# Patient Record
Sex: Male | Born: 1965 | Race: White | Hispanic: No | Marital: Single | Smoking: Never smoker
Health system: Southern US, Community
[De-identification: ages and names within clinical notes are randomized; demographics above are authoritative.]

## PROBLEM LIST (undated history)

## (undated) DIAGNOSIS — I10 Essential (primary) hypertension: Secondary | ICD-10-CM

## (undated) DIAGNOSIS — F840 Autistic disorder: Secondary | ICD-10-CM

## (undated) DIAGNOSIS — F909 Attention-deficit hyperactivity disorder, unspecified type: Secondary | ICD-10-CM

## (undated) DIAGNOSIS — N3941 Urge incontinence: Secondary | ICD-10-CM

## (undated) DIAGNOSIS — M47816 Spondylosis without myelopathy or radiculopathy, lumbar region: Secondary | ICD-10-CM

## (undated) DIAGNOSIS — M8580 Other specified disorders of bone density and structure, unspecified site: Secondary | ICD-10-CM

## (undated) DIAGNOSIS — F419 Anxiety disorder, unspecified: Secondary | ICD-10-CM

## (undated) DIAGNOSIS — E559 Vitamin D deficiency, unspecified: Secondary | ICD-10-CM

## (undated) DIAGNOSIS — Q992 Fragile X chromosome: Secondary | ICD-10-CM

## (undated) DIAGNOSIS — E781 Pure hyperglyceridemia: Secondary | ICD-10-CM

## (undated) DIAGNOSIS — E039 Hypothyroidism, unspecified: Secondary | ICD-10-CM

## (undated) DIAGNOSIS — D696 Thrombocytopenia, unspecified: Secondary | ICD-10-CM

## (undated) HISTORY — PX: ELBOW SURGERY: SHX618

## (undated) HISTORY — PX: DENTAL SURGERY: SHX609

---

## 2009-07-17 ENCOUNTER — Emergency Department (HOSPITAL_BASED_OUTPATIENT_CLINIC_OR_DEPARTMENT_OTHER): Admission: EM | Admit: 2009-07-17 | Discharge: 2009-07-17 | Payer: Self-pay | Admitting: Emergency Medicine

## 2009-07-17 ENCOUNTER — Ambulatory Visit: Payer: Self-pay | Admitting: Radiology

## 2009-07-23 ENCOUNTER — Ambulatory Visit (HOSPITAL_COMMUNITY): Admission: RE | Admit: 2009-07-23 | Discharge: 2009-07-25 | Payer: Self-pay | Admitting: Orthopedic Surgery

## 2010-12-22 LAB — CBC
MCHC: 35.7 g/dL (ref 30.0–36.0)
MCV: 92.9 fL (ref 78.0–100.0)
RDW: 12.6 % (ref 11.5–15.5)

## 2010-12-22 LAB — BASIC METABOLIC PANEL
CO2: 26 mEq/L (ref 19–32)
Creatinine, Ser: 0.75 mg/dL (ref 0.4–1.5)
Glucose, Bld: 129 mg/dL — ABNORMAL HIGH (ref 70–99)
Potassium: 4.3 mEq/L (ref 3.5–5.1)
Sodium: 135 mEq/L (ref 135–145)

## 2011-05-24 IMAGING — CR DG CHEST 2V
2 series · 2 of 2 positions shown · non-contrast
Comparison: None

CLINICAL DATA: Preop left elbow surgery.

CHEST - 2 VIEW

[view not recorded (1 of 2)]
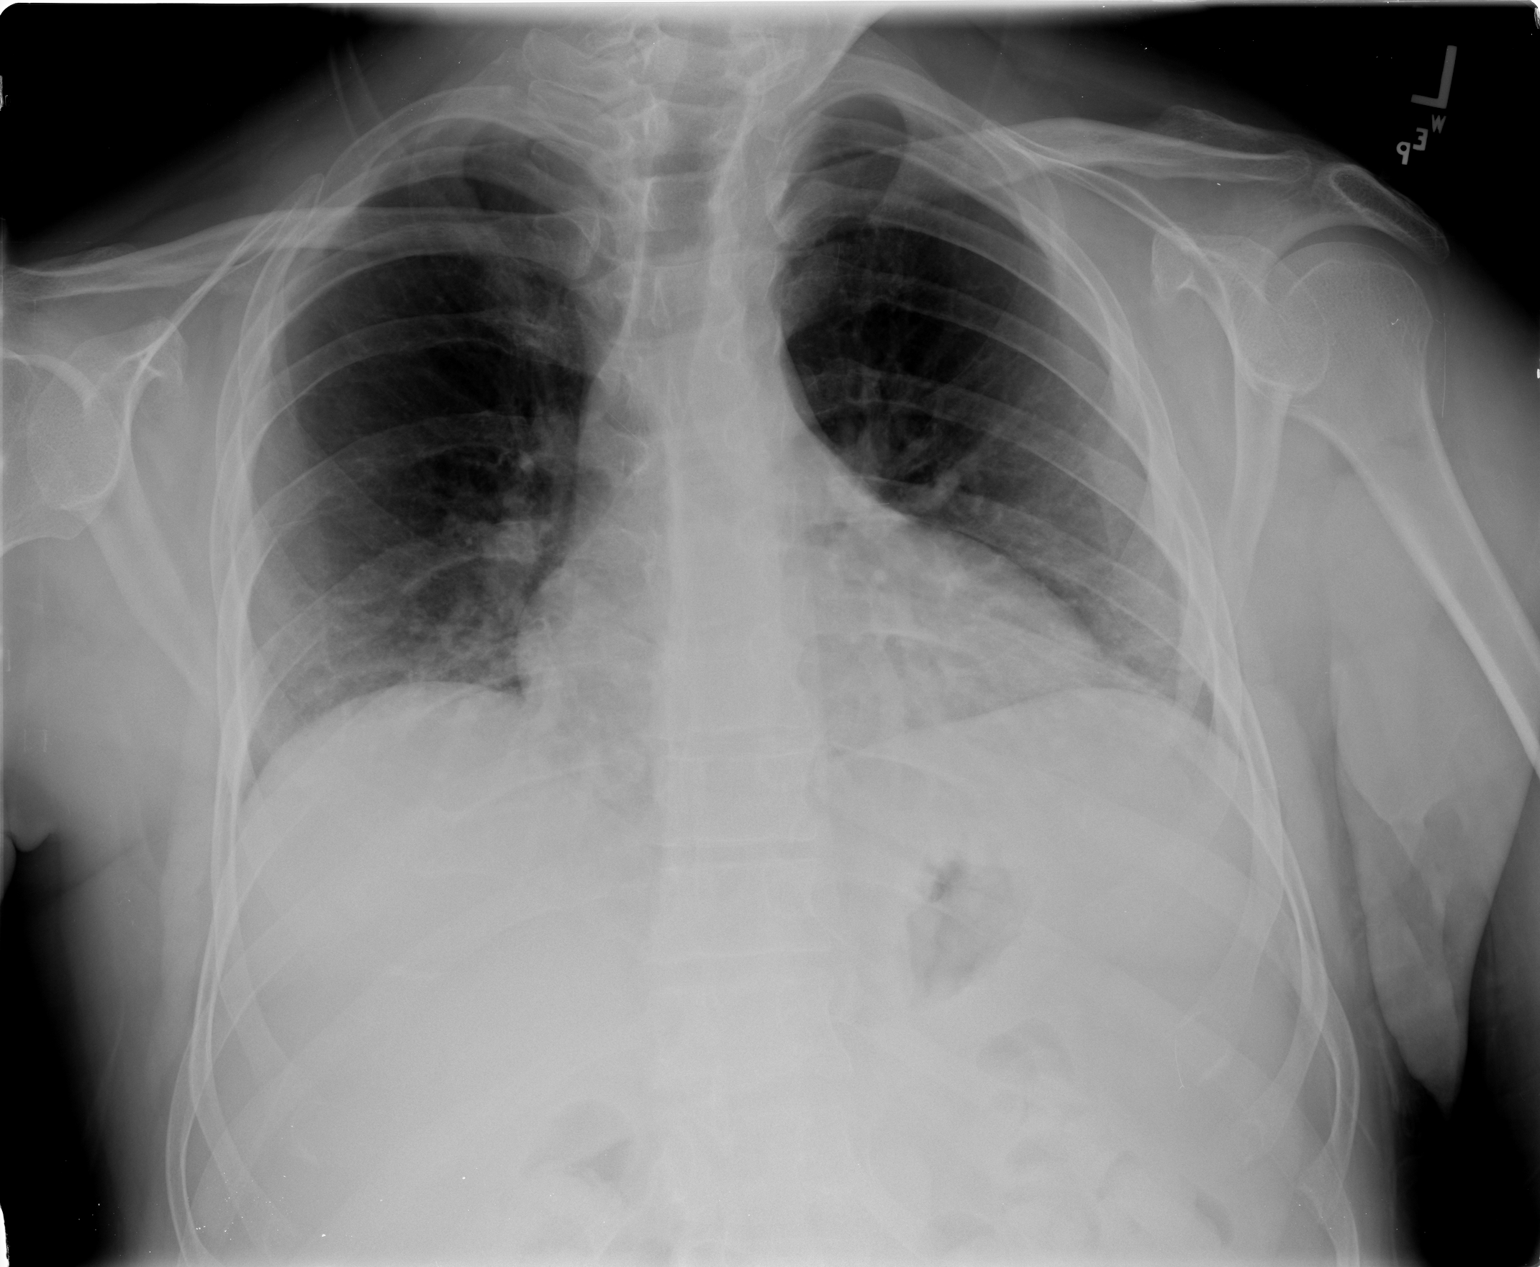

[view not recorded (2 of 2)]
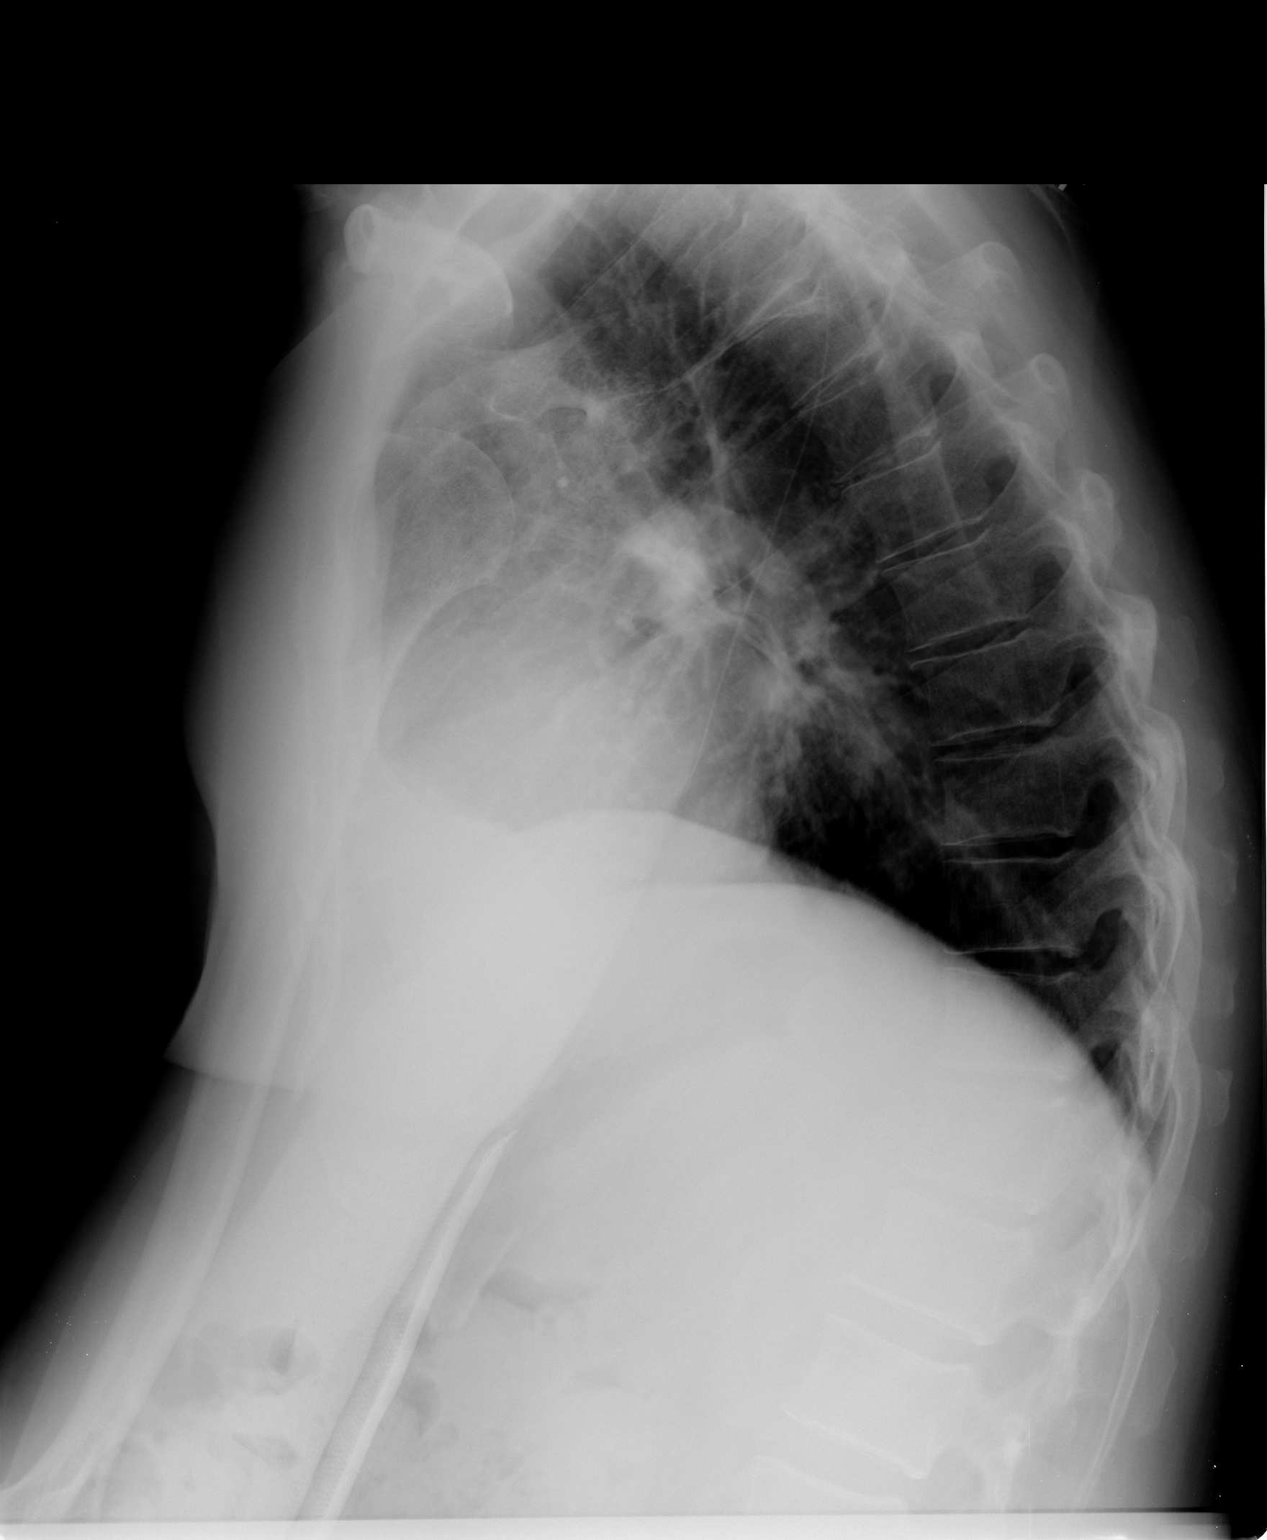

[2 of 2 positions shown; findings below may reference images not displayed]

FINDINGS: The heart size is mildly enlarged.

There is no pleural effusion or interstitial edema.

The lung volumes are low.

There is bibasilar atelectasis.

No airspace consolidation.
IMPRESSION: 1.  Low lung volumes and bibasilar atelectasis.

## 2011-05-25 IMAGING — CR DG ELBOW 2V*L*
2 series · 2 of 2 positions shown · non-contrast
Comparison: Preoperative films of 07/17/2009

CLINICAL DATA: Fixation of left distal humeral fracture

LEFT ELBOW - 2 VIEW

[view not recorded (1 of 2)]
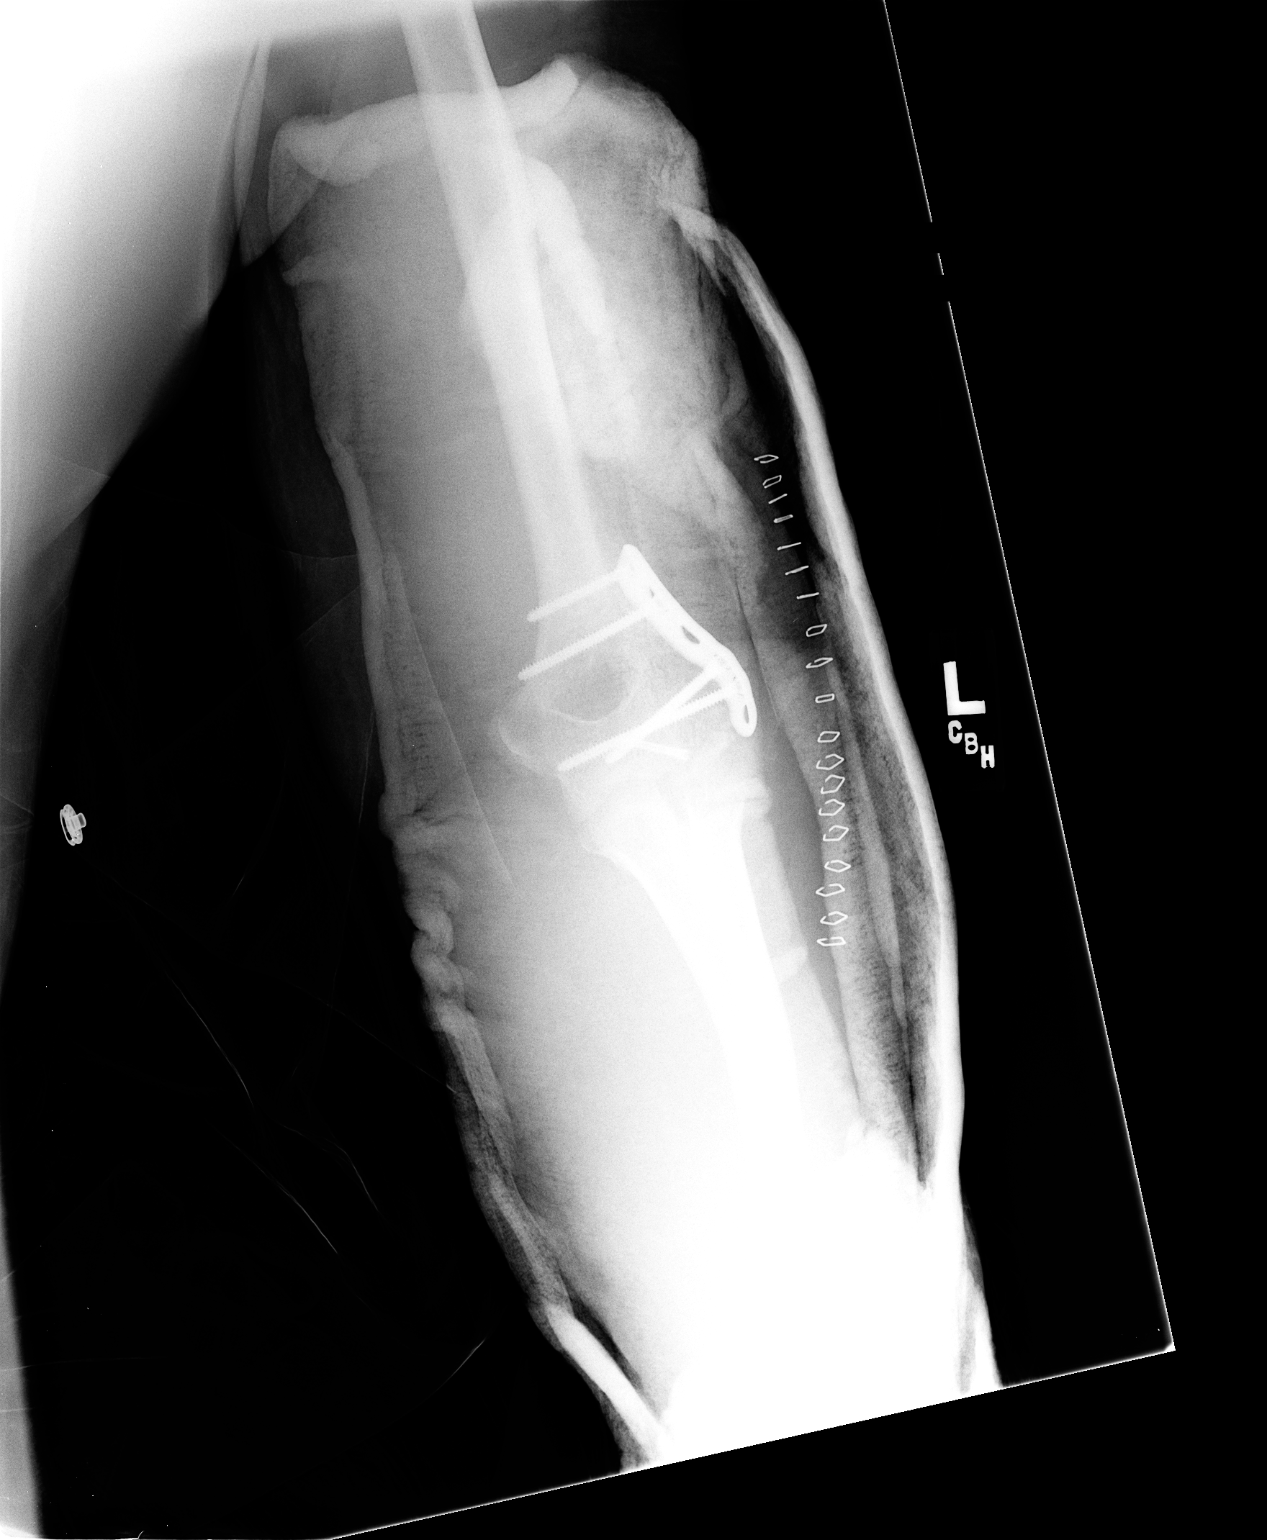

[view not recorded (2 of 2)]
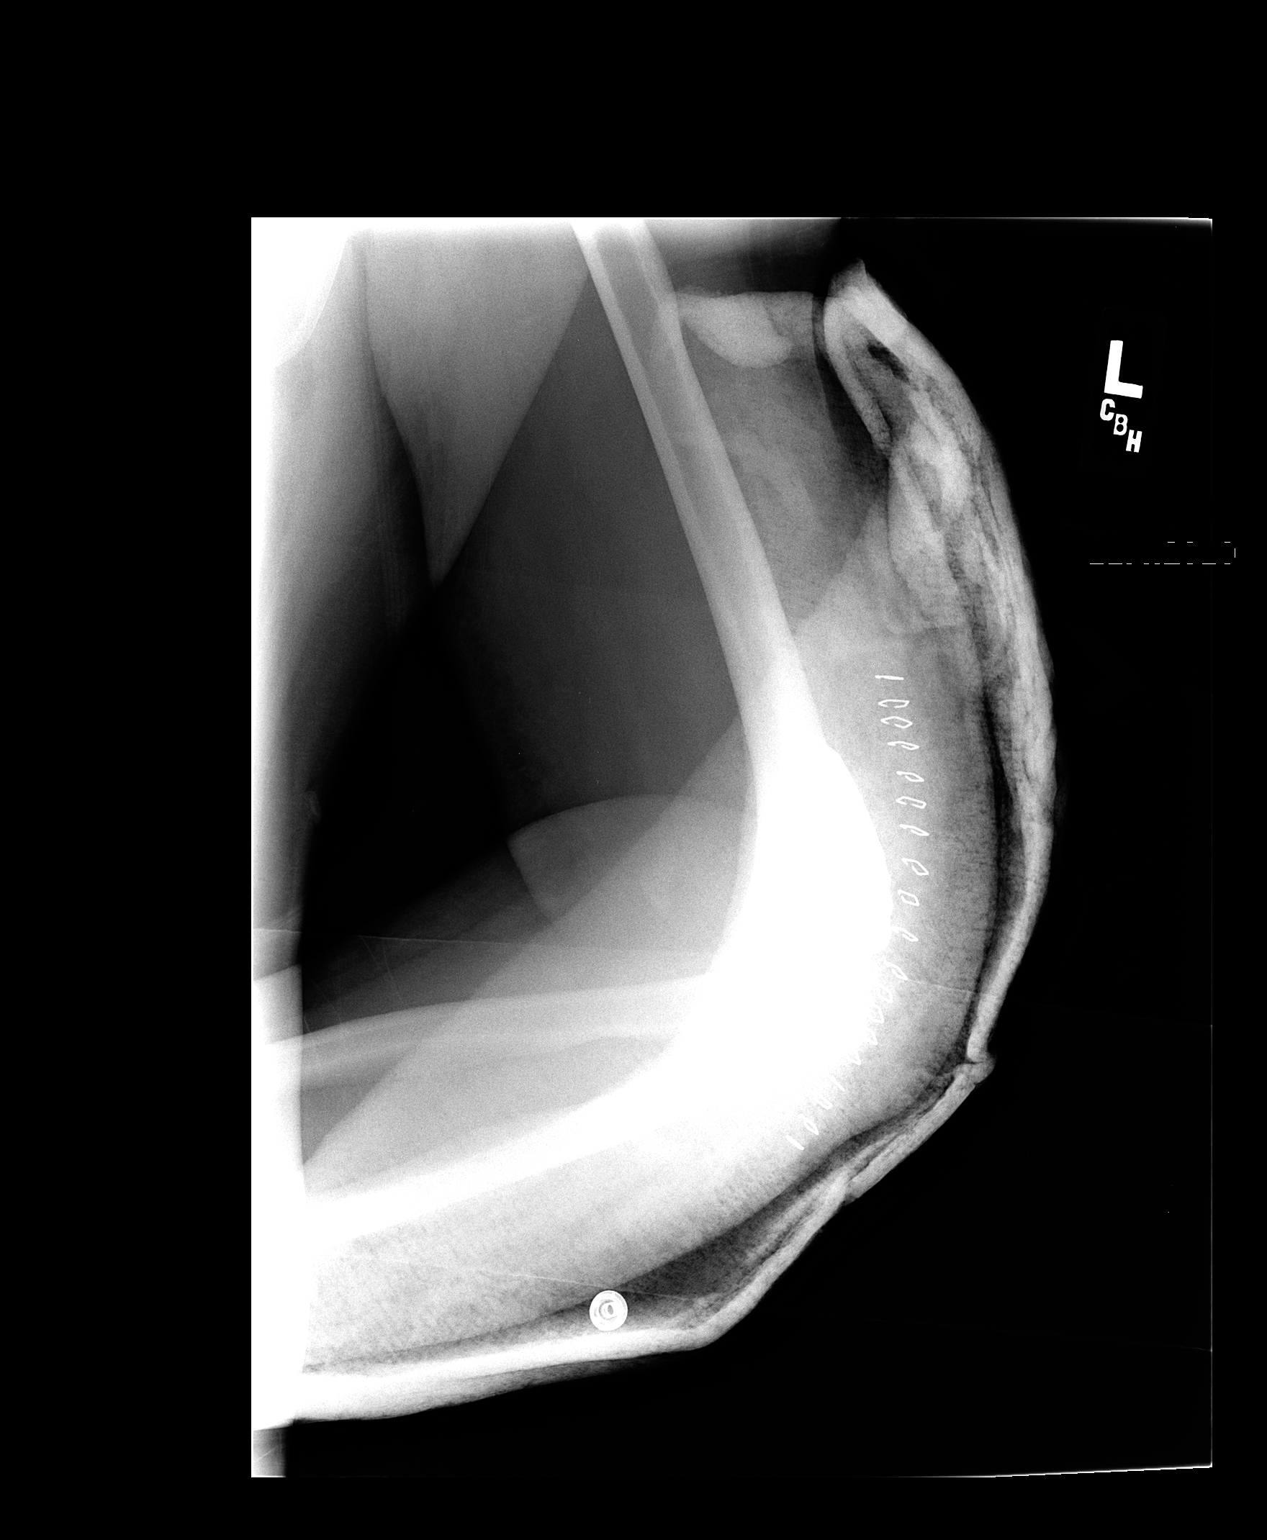

[2 of 2 positions shown; findings below may reference images not displayed]

FINDINGS: Plate and screw fixation of the distal left lateral
humeral epicondylar fracture is noted in good position, with the
left arm in cast.
IMPRESSION: Fixation of distal left lateral humeral epicondylar fracture.

## 2011-05-26 IMAGING — CR DG ELBOW 2V*L*
1 series · 1 of 1 positions shown · non-contrast
Comparison: Left elbow films of 07/24/1999

CLINICAL DATA: Postop fixation of distal left humeral fracture

LEFT ELBOW - 2 VIEW

[x elbow joint lat left *]
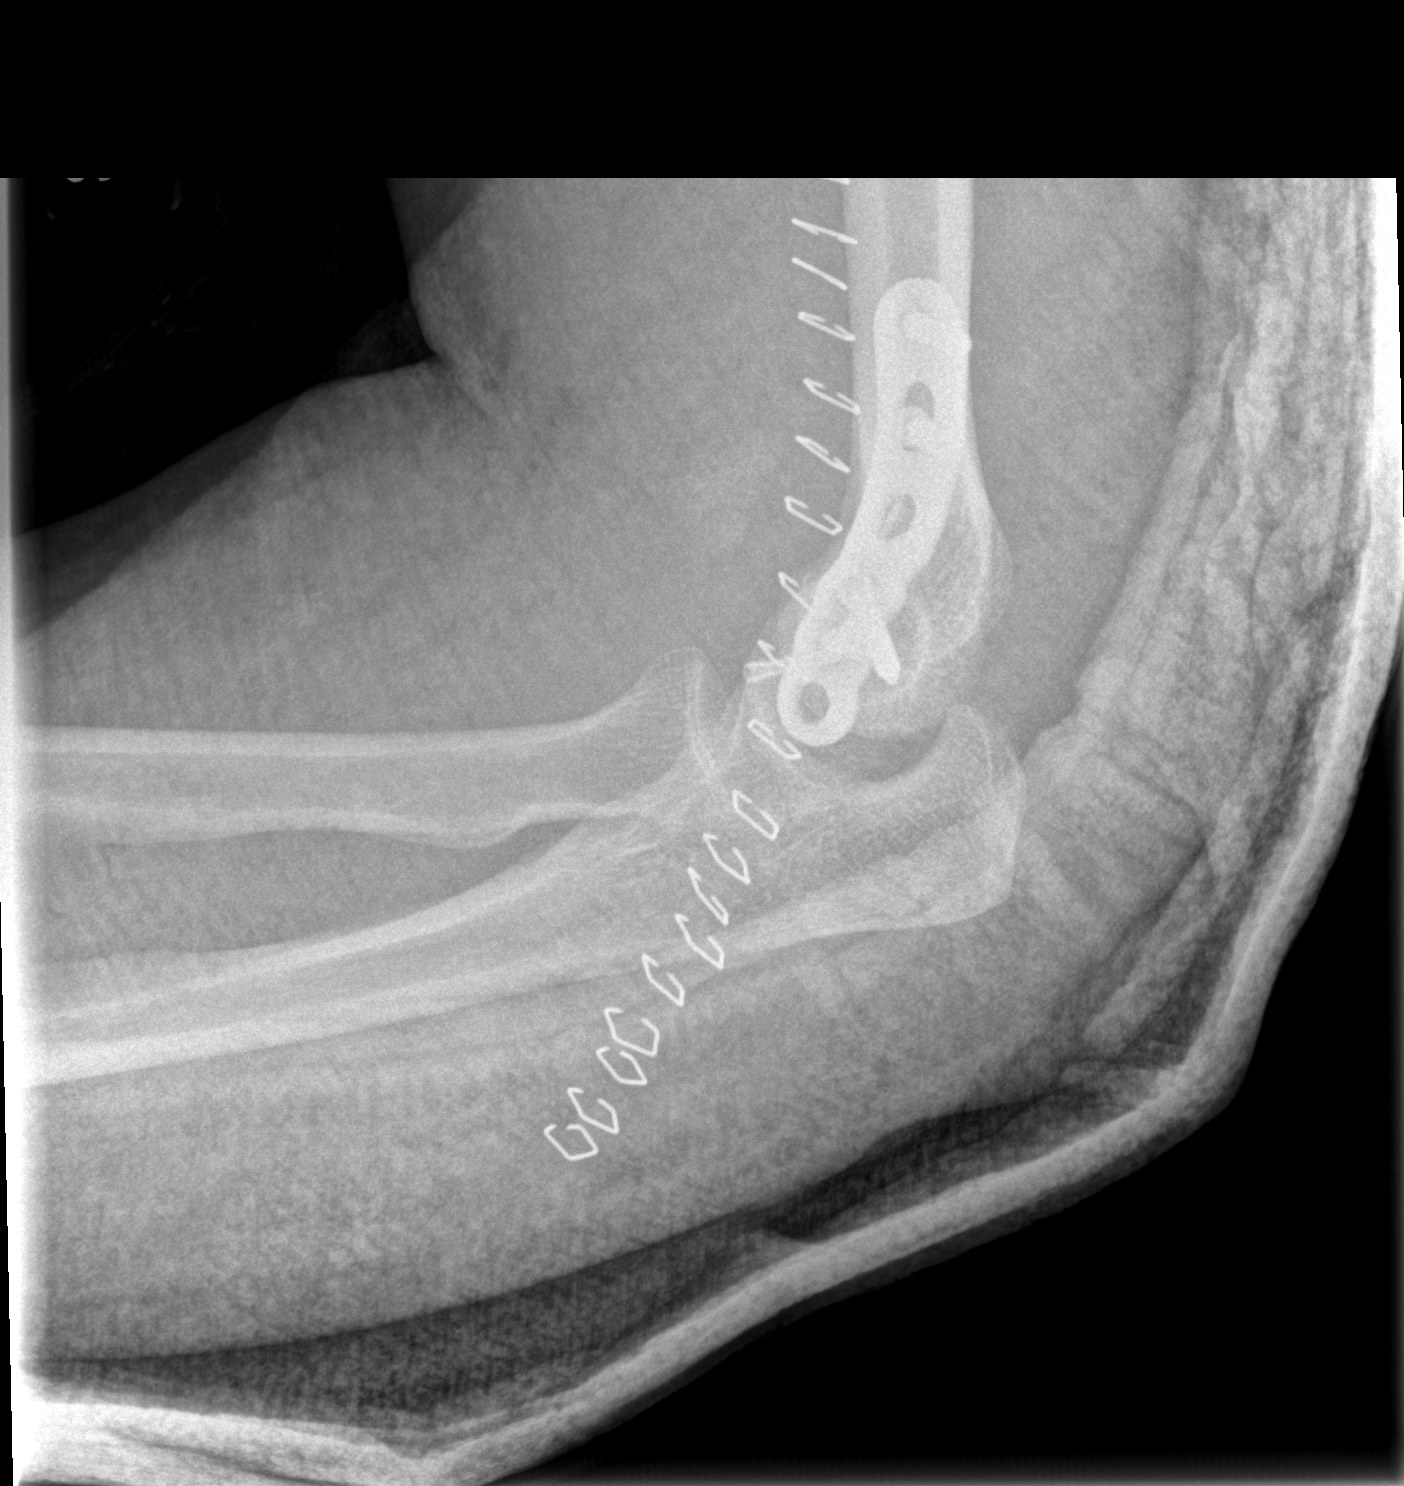

[1 of 1 positions shown; findings below may reference images not displayed]

FINDINGS: Then a better lateral view was obtained.  This view shows
the fixation plate and screws to be in good position for fixation
of the distal left humeral lateral epicondylar fracture.  Normal
anatomic alignment is present.
IMPRESSION: Plate and screw fixation of distal left humeral lateral epicondylar
fracture in good position.

## 2014-08-28 ENCOUNTER — Other Ambulatory Visit: Payer: Self-pay | Admitting: Endocrinology

## 2014-09-29 ENCOUNTER — Other Ambulatory Visit: Payer: Self-pay | Admitting: Endocrinology

## 2019-11-11 ENCOUNTER — Ambulatory Visit: Payer: Medicare Other | Attending: Internal Medicine

## 2019-11-11 DIAGNOSIS — Z20822 Contact with and (suspected) exposure to covid-19: Secondary | ICD-10-CM

## 2019-11-12 LAB — NOVEL CORONAVIRUS, NAA: SARS-CoV-2, NAA: NOT DETECTED

## 2020-04-20 ENCOUNTER — Encounter (HOSPITAL_COMMUNITY): Payer: Self-pay | Admitting: *Deleted

## 2020-04-20 ENCOUNTER — Other Ambulatory Visit: Payer: Self-pay

## 2020-04-20 ENCOUNTER — Ambulatory Visit: Payer: Self-pay | Admitting: Dentistry

## 2020-04-20 NOTE — Progress Notes (Signed)
Anesthesia Chart Review:  Patient has a history of Fragile X syndrome and lives in a group home.  Patient had recent preop exam by PCP on 04/02/2020.  Per note, "Patient is here with his caretaker for pre-op exam. He is going to be put to sleep for cavity cleaning and possible tooth extraction. He's had wisdom teeth removed in the past. He is very healthy outside of his disability. He lives in a group home for people with disabilities and his caretaker says he does well there. He has lived in this home for 25 years. He needs an exam but had f/u with Dr. Patsey Berthold in June and labs were done which were good. They have no questions for [place] today.Marland KitchenMarland KitchenPatient appears well and I can clear him for surgery at the dentist. I've filled out the form for him."  Will need day of surgery labs and eval.   [facility] Short Stay Center/Anesthesiology Phone [phone] 04/20/2020 10:00 AM'

## 2020-04-20 NOTE — Anesthesia Preprocedure Evaluation (Addendum)
Anesthesia Evaluation  Patient identified by MRN, date of birth, ID band Patient awake    Reviewed: Allergy & Precautions, NPO status , Patient's Chart, lab work & pertinent test results, reviewed documented beta blocker date and time   Airway Mallampati: III  TM Distance: >3 FB Neck ROM: Full    Dental no notable dental hx. (+) Teeth Intact   Pulmonary neg pulmonary ROS,    Pulmonary exam normal breath sounds clear to auscultation       Cardiovascular hypertension, Pt. on medications and Pt. on home beta blockers Normal cardiovascular exam Rhythm:Regular Rate:Normal  EKG sinus brady at 45bpm   Neuro/Psych PSYCHIATRIC DISORDERS Anxiety Autism, fragile X syndrome     GI/Hepatic negative GI ROS, Neg liver ROS,   Endo/Other  Hypothyroidism Obesity BMI 30  Renal/GU negative Renal ROS Bladder dysfunction      Musculoskeletal  (+) Arthritis , Osteoarthritis,    Abdominal   Peds negative pediatric ROS (+)  Hematology negative hematology ROS (+)   Anesthesia Other Findings   Reproductive/Obstetrics negative OB ROS                           Anesthesia Physical Anesthesia Plan  ASA: III  Anesthesia Plan: General   Post-op Pain Management:    Induction: Intravenous  PONV Risk Score and Plan: Ondansetron, Dexamethasone and Treatment may vary due to age or medical condition  Airway Management Planned: Nasal ETT  Additional Equipment: None  Intra-op Plan:   Post-operative Plan: Extubation in OR  Informed Consent: I have reviewed the patients History and Physical, chart, labs and discussed the procedure including the risks, benefits and alternatives for the proposed anesthesia with the patient or authorized representative who has indicated his/her understanding and acceptance.     Dental advisory given  Plan Discussed with: CRNA  Anesthesia Plan Comments: (  )        Anesthesia Quick Evaluation

## 2020-04-20 NOTE — Progress Notes (Addendum)
I spoke with Alden Server coordinator at [facility]; Valentina Gu confirmed that Dr. Andrey Campanile will be doibng the surgery tomorrow.  I also told Valentina Gu that I have called both numbers that I have for Justin Kerr at the group home that Justin Kerr lives in; I left messages and have not had a return call.  Valentina Gu said she will call Mr. Justin Kerr. Mr. Justin Kerr called, he states that patient has not complained chest pain or shortness of breath. Mr. Justin Kerr will be tested for Covid on arrival.  Mr. Justin Kerr states that  Patient has not had exposure to anyone with s/s of Covid.  Justin Kerr follows comands., "you just have to be direct with him, " per caregiver.

## 2020-04-21 ENCOUNTER — Other Ambulatory Visit: Payer: Self-pay

## 2020-04-21 ENCOUNTER — Encounter (HOSPITAL_COMMUNITY)
Admission: RE | Disposition: A | Discharge status: Home / Self Care | Payer: Self-pay | Source: Home / Self Care | Attending: Dentistry

## 2020-04-21 ENCOUNTER — Ambulatory Visit (HOSPITAL_COMMUNITY): Payer: Medicare Other | Admitting: Physician Assistant

## 2020-04-21 ENCOUNTER — Ambulatory Visit (HOSPITAL_COMMUNITY)
Admission: RE | Admit: 2020-04-21 | Discharge: 2020-04-21 | Disposition: A | Discharge status: Home / Self Care | Payer: Medicare Other | Attending: Dentistry | Admitting: Dentistry

## 2020-04-21 ENCOUNTER — Encounter (HOSPITAL_COMMUNITY): Payer: Self-pay

## 2020-04-21 DIAGNOSIS — Z79899 Other long term (current) drug therapy: Secondary | ICD-10-CM | POA: Insufficient documentation

## 2020-04-21 DIAGNOSIS — E039 Hypothyroidism, unspecified: Secondary | ICD-10-CM | POA: Insufficient documentation

## 2020-04-21 DIAGNOSIS — Z6830 Body mass index (BMI) 30.0-30.9, adult: Secondary | ICD-10-CM | POA: Insufficient documentation

## 2020-04-21 DIAGNOSIS — Z20822 Contact with and (suspected) exposure to covid-19: Secondary | ICD-10-CM | POA: Insufficient documentation

## 2020-04-21 DIAGNOSIS — F419 Anxiety disorder, unspecified: Secondary | ICD-10-CM | POA: Insufficient documentation

## 2020-04-21 DIAGNOSIS — Q992 Fragile X chromosome: Secondary | ICD-10-CM | POA: Insufficient documentation

## 2020-04-21 DIAGNOSIS — R32 Unspecified urinary incontinence: Secondary | ICD-10-CM | POA: Insufficient documentation

## 2020-04-21 DIAGNOSIS — K08409 Partial loss of teeth, unspecified cause, unspecified class: Secondary | ICD-10-CM | POA: Diagnosis not present

## 2020-04-21 DIAGNOSIS — F840 Autistic disorder: Secondary | ICD-10-CM | POA: Insufficient documentation

## 2020-04-21 DIAGNOSIS — I10 Essential (primary) hypertension: Secondary | ICD-10-CM | POA: Diagnosis not present

## 2020-04-21 DIAGNOSIS — E669 Obesity, unspecified: Secondary | ICD-10-CM | POA: Insufficient documentation

## 2020-04-21 DIAGNOSIS — K029 Dental caries, unspecified: Secondary | ICD-10-CM | POA: Diagnosis present

## 2020-04-21 DIAGNOSIS — E559 Vitamin D deficiency, unspecified: Secondary | ICD-10-CM | POA: Insufficient documentation

## 2020-04-21 HISTORY — DX: Pure hyperglyceridemia: E78.1

## 2020-04-21 HISTORY — DX: Vitamin D deficiency, unspecified: E55.9

## 2020-04-21 HISTORY — DX: Spondylosis without myelopathy or radiculopathy, lumbar region: M47.816

## 2020-04-21 HISTORY — DX: Essential (primary) hypertension: I10

## 2020-04-21 HISTORY — PX: DENTAL RESTORATION/EXTRACTION WITH X-RAY: SHX5796

## 2020-04-21 HISTORY — DX: Autistic disorder: F84.0

## 2020-04-21 HISTORY — DX: Attention-deficit hyperactivity disorder, unspecified type: F90.9

## 2020-04-21 HISTORY — DX: Fragile x chromosome: Q99.2

## 2020-04-21 HISTORY — DX: Hypothyroidism, unspecified: E03.9

## 2020-04-21 HISTORY — DX: Thrombocytopenia, unspecified: D69.6

## 2020-04-21 HISTORY — DX: Urge incontinence: N39.41

## 2020-04-21 HISTORY — DX: Other specified disorders of bone density and structure, unspecified site: M85.80

## 2020-04-21 HISTORY — DX: Anxiety disorder, unspecified: F41.9

## 2020-04-21 LAB — SARS CORONAVIRUS 2 BY RT PCR (HOSPITAL ORDER, PERFORMED IN ~~LOC~~ HOSPITAL LAB): SARS Coronavirus 2: NEGATIVE

## 2020-04-21 SURGERY — DENTAL RESTORATION/EXTRACTION WITH X-RAY
Anesthesia: General | Site: Mouth

## 2020-04-21 MED ORDER — 0.9 % SODIUM CHLORIDE (POUR BTL) OPTIME
TOPICAL | Status: DC | PRN
  Administered 2020-04-21 (×2): 1000 mL

## 2020-04-21 MED ORDER — ROCURONIUM BROMIDE 10 MG/ML (PF) SYRINGE
PREFILLED_SYRINGE | INTRAVENOUS | Status: AC
  Filled 2020-04-21: qty 10

## 2020-04-21 MED ORDER — MIDAZOLAM HCL 2 MG/2ML IJ SOLN
INTRAMUSCULAR | Status: DC | PRN
  Administered 2020-04-21: 2 mg via INTRAVENOUS

## 2020-04-21 MED ORDER — FENTANYL CITRATE (PF) 250 MCG/5ML IJ SOLN
INTRAMUSCULAR | Status: AC
  Filled 2020-04-21: qty 5

## 2020-04-21 MED ORDER — DEXAMETHASONE SODIUM PHOSPHATE 10 MG/ML IJ SOLN
INTRAMUSCULAR | Status: AC
  Filled 2020-04-21: qty 1

## 2020-04-21 MED ORDER — ROCURONIUM BROMIDE 10 MG/ML (PF) SYRINGE
PREFILLED_SYRINGE | INTRAVENOUS | Status: DC | PRN
  Administered 2020-04-21: 50 mg via INTRAVENOUS

## 2020-04-21 MED ORDER — PROPOFOL 10 MG/ML IV BOLUS
INTRAVENOUS | Status: DC | PRN
  Administered 2020-04-21: 200 mg via INTRAVENOUS

## 2020-04-21 MED ORDER — ACETAMINOPHEN 10 MG/ML IV SOLN
INTRAVENOUS | Status: AC
  Filled 2020-04-21: qty 100

## 2020-04-21 MED ORDER — LIDOCAINE-EPINEPHRINE 2 %-1:100000 IJ SOLN
INTRAMUSCULAR | Status: AC
  Filled 2020-04-21: qty 5.1

## 2020-04-21 MED ORDER — LIDOCAINE 2% (20 MG/ML) 5 ML SYRINGE
INTRAMUSCULAR | Status: AC
  Filled 2020-04-21: qty 5

## 2020-04-21 MED ORDER — MIDAZOLAM HCL 2 MG/2ML IJ SOLN
INTRAMUSCULAR | Status: AC
  Filled 2020-04-21: qty 2

## 2020-04-21 MED ORDER — DEXAMETHASONE SODIUM PHOSPHATE 10 MG/ML IJ SOLN
INTRAMUSCULAR | Status: DC | PRN
  Administered 2020-04-21: 10 mg via INTRAVENOUS

## 2020-04-21 MED ORDER — LIDOCAINE 2% (20 MG/ML) 5 ML SYRINGE
INTRAMUSCULAR | Status: DC | PRN
  Administered 2020-04-21: 100 mg via INTRAVENOUS

## 2020-04-21 MED ORDER — ONDANSETRON HCL 4 MG/2ML IJ SOLN
INTRAMUSCULAR | Status: DC | PRN
  Administered 2020-04-21: 4 mg via INTRAVENOUS

## 2020-04-21 MED ORDER — FENTANYL CITRATE (PF) 250 MCG/5ML IJ SOLN
INTRAMUSCULAR | Status: DC | PRN
  Administered 2020-04-21: 100 ug via INTRAVENOUS
  Administered 2020-04-21 (×3): 50 ug via INTRAVENOUS

## 2020-04-21 MED ORDER — PROPOFOL 10 MG/ML IV BOLUS
INTRAVENOUS | Status: AC
  Filled 2020-04-21: qty 20

## 2020-04-21 MED ORDER — SUGAMMADEX SODIUM 200 MG/2ML IV SOLN
INTRAVENOUS | Status: DC | PRN
  Administered 2020-04-21: 200 mg via INTRAVENOUS

## 2020-04-21 MED ORDER — CHLORHEXIDINE GLUCONATE 0.12 % MT SOLN
15.0000 mL | Freq: Once | OROMUCOSAL | Status: AC
  Administered 2020-04-21: 15 mL via OROMUCOSAL

## 2020-04-21 MED ORDER — ONDANSETRON HCL 4 MG/2ML IJ SOLN
INTRAMUSCULAR | Status: AC
  Filled 2020-04-21: qty 2

## 2020-04-21 MED ORDER — ACETAMINOPHEN 10 MG/ML IV SOLN
INTRAVENOUS | Status: DC | PRN
Start: 2020-04-21 — End: 2020-04-21
  Administered 2020-04-21: 1000 mg via INTRAVENOUS

## 2020-04-21 MED ORDER — OXYMETAZOLINE HCL 0.05 % NA SOLN
NASAL | Status: AC
  Filled 2020-04-21: qty 30

## 2020-04-21 MED ORDER — STERILE WATER FOR IRRIGATION IR SOLN
Status: DC | PRN
  Administered 2020-04-21: 1000 mL

## 2020-04-21 MED ORDER — ORAL CARE MOUTH RINSE: 15.0000 mL | Freq: Once | OROMUCOSAL | Status: AC

## 2020-04-21 MED ORDER — LACTATED RINGERS IV SOLN: INTRAVENOUS | Status: DC

## 2020-04-21 SURGICAL SUPPLY — 37 items
BLADE SURG 15 STRL LF DISP TIS (BLADE) ×1 IMPLANT
BLADE SURG 15 STRL SS (BLADE)
BUR ROUND PRECISION 4.0 (BURR) ×1 IMPLANT
BUR ROUND PRECISION 4.0MM (BURR)
CANISTER SUCT 3000ML PPV (MISCELLANEOUS) ×3 IMPLANT
CONT SPEC 4OZ CLIKSEAL STRL BL (MISCELLANEOUS) ×1 IMPLANT
COVER BACK TABLE 60X90IN (DRAPES) ×3 IMPLANT
COVER MAYO STAND STRL (DRAPES) ×3 IMPLANT
COVER PROBE W GEL 5X96 (DRAPES) IMPLANT
COVER SURGICAL LIGHT HANDLE (MISCELLANEOUS) ×3 IMPLANT
COVER WAND RF STERILE (DRAPES) IMPLANT
DRAPE HALF SHEET 40X57 (DRAPES) ×1 IMPLANT
DRAPE ORTHO SPLIT 87X125 STRL (DRAPES) ×2 IMPLANT
GAUZE 4X4 16PLY RFD (DISPOSABLE) ×3 IMPLANT
GAUZE SPONGE 4X4 16PLY XRAY LF (GAUZE/BANDAGES/DRESSINGS) ×2 IMPLANT
GLOVE EXAM NITRILE PF SM BLUE (GLOVE) ×3 IMPLANT
GLOVE SURG SS PI 8.0 STRL IVOR (GLOVE) ×3 IMPLANT
GOWN STRL REUS W/ TWL XL LVL3 (GOWN DISPOSABLE) ×2 IMPLANT
GOWN STRL REUS W/TWL XL LVL3 (GOWN DISPOSABLE) ×6
KIT BASIN OR (CUSTOM PROCEDURE TRAY) ×3 IMPLANT
KIT TURNOVER KIT B (KITS) ×3 IMPLANT
NDL 25GX 5/8IN NON SAFETY (NEEDLE) ×1 IMPLANT
NDL PRECISIONGLIDE 27X1.5 (NEEDLE) IMPLANT
NEEDLE 25GX 5/8IN NON SAFETY (NEEDLE) IMPLANT
NEEDLE PRECISIONGLIDE 27X1.5 (NEEDLE) IMPLANT
PAD ARMBOARD 7.5X6 YLW CONV (MISCELLANEOUS) ×6 IMPLANT
PAD SHARPS MAGNETIC DISPOSAL (MISCELLANEOUS) ×1 IMPLANT
SPONGE SURGIFOAM ABS GEL SZ50 (HEMOSTASIS) IMPLANT
SUT CHROMIC 3 0 PS 2 (SUTURE) IMPLANT
SYR BULB IRRIG 60ML STRL (SYRINGE) ×3 IMPLANT
SYR CONTROL 10ML LL (SYRINGE) ×3 IMPLANT
TOOTHBRUSH ADULT (PERSONAL CARE ITEMS) ×3 IMPLANT
TOWEL GREEN STERILE (TOWEL DISPOSABLE) ×3 IMPLANT
TUBE CONNECTING 12'X1/4 (SUCTIONS) ×1
TUBE CONNECTING 12X1/4 (SUCTIONS) ×2 IMPLANT
WATER TABLETS ICX (MISCELLANEOUS) ×1 IMPLANT
YANKAUER SUCT BULB TIP NO VENT (SUCTIONS) ×3 IMPLANT

## 2020-04-21 NOTE — H&P (Signed)
Justin Kerr is an 54 y.o. male.   Chief Complaint: Tooth pain in the upper left HPI: Patient has multiple areas of decay and cannot tolerate procedures in a traditional dental chair.  Past Medical History:  Diagnosis Date  . ADHD   . Anxiety   . Autism   . Fragile X syndrome   . Hypertension   . Hypertriglyceridemia   . Hypothyroidism    acquired  . Lumbar spondylosis   . Osteopenia   . Thrombocytopenia (HCC)   . Urge incontinence of urine   . Vitamin D deficiency     Past Surgical History:  Procedure Laterality Date  . DENTAL SURGERY     wisdom teeth, dental  . ELBOW SURGERY Left     History reviewed. No pertinent family history. Social History:  reports that he has never smoked. He has never used smokeless tobacco. He reports that he does not drink alcohol and does not use drugs.  Allergies: No Known Allergies  Medications Prior to Admission  Medication Sig Dispense Refill  . baclofen (LIORESAL) 10 MG tablet Take 10 mg by mouth 3 (three) times daily as needed for muscle spasms.    . calcium carbonate (OSCAL) 1500 (600 Ca) MG TABS tablet Take 600 mg of elemental calcium by mouth 2 (two) times daily with a meal.    . cholecalciferol (VITAMIN D3) 25 MCG (1000 UNIT) tablet Take 1,000 Units by mouth daily.    . clonazePAM (KLONOPIN) 0.5 MG tablet Take 0.5-1 mg by mouth See admin instructions. 0.5 mg in the morning, 1 mg in the evening    . divalproex (DEPAKOTE ER) 500 MG 24 hr tablet Take 1,500 mg by mouth at bedtime.    . DULoxetine (CYMBALTA) 60 MG capsule Take 60 mg by mouth daily.    Marland Kitchen levothyroxine (SYNTHROID) 75 MCG tablet Take 75 mcg by mouth daily before breakfast.    . LORazepam (ATIVAN) 0.5 MG tablet Take 0.5-1 mg by mouth every 4 (four) hours as needed for anxiety.    . meloxicam (MOBIC) 7.5 MG tablet Take 7.5 mg by mouth daily as needed for pain.    . metoprolol succinate (TOPROL-XL) 100 MG 24 hr tablet Take 100 mg by mouth daily. Take with or immediately  following a meal.    . oxybutynin (DITROPAN XL) 15 MG 24 hr tablet Take 15 mg by mouth at bedtime.    . polyethylene glycol (MIRALAX / GLYCOLAX) 17 g packet Take 17 g by mouth daily as needed for moderate constipation.    . terbinafine (LAMISIL) 1 % cream Apply 1 application topically 2 (two) times daily as needed (irritation).    . traZODone (DESYREL) 50 MG tablet Take 100 mg by mouth at bedtime.    . Vitamin D, Ergocalciferol, (DRISDOL) 1.25 MG (50000 UNIT) CAPS capsule Take 50,000 Units by mouth every 14 (fourteen) days.      Results for orders placed or performed during the hospital encounter of 04/21/20 (from the past 48 hour(s))  SARS Coronavirus 2 by RT PCR (hospital order, performed in [facility] hospital lab) Nasopharyngeal Nasopharyngeal Swab     Status: None   Collection Time: 04/21/20  8:19 AM   Specimen: Nasopharyngeal Swab  Result Value Ref Range   SARS Coronavirus 2 NEGATIVE NEGATIVE    Comment: (NOTE) SARS-CoV-2 target nucleic acids are NOT DETECTED.  The SARS-CoV-2 RNA is generally detectable in upper and lower respiratory specimens during the acute phase of infection. The lowest concentration of SARS-CoV-2  viral copies this assay can detect is 250 copies / mL. A negative result does not preclude SARS-CoV-2 infection and should not be used as the sole basis for treatment or other patient management decisions.  A negative result may occur with improper specimen collection / handling, submission of specimen other than nasopharyngeal swab, presence of viral mutation(s) within the areas targeted by this assay, and inadequate number of viral copies (<250 copies / mL). A negative result must be combined with clinical observations, patient history, and epidemiological information.  Fact Sheet for Patients:   BoilerBrush.com.cy  Fact Sheet for Healthcare Providers: https://pope.com/  This test is not yet approved or  cleared  by the [place] FDA and has been authorized for detection and/or diagnosis of SARS-CoV-2 by FDA under an Emergency Use Authorization (EUA).  This EUA will remain in effect (meaning this test can be used) for the duration of the COVID-19 declaration under Section 564(b)(1) of the Act, 21 U.S.C. section 360bbb-3(b)(1), unless the authorization is terminated or revoked sooner.  Performed at [facility], [address], [place], [place] [address]    No results found.  Review of Systems  Blood pressure 133/77, pulse (!) 46, temperature 98 F (36.7 C), resp. rate 18, height 5\' 7"  (1.702 m), weight 87.1 kg, SpO2 96 %. Physical Exam   Assessment/Plan Dental rehab under general anesthesia.  I have reviewed the history and physical located in the electronic medical record, that have been performed within the 30 days prior to surgery today, and I concur with the assessment therein. I have seen and examined the patient today and I find no substantive changes that would alter the course of therapy/surgery today.  , DMD 04/21/2020, 10:21 AM

## 2020-04-21 NOTE — Op Note (Signed)
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 Dental Care provided in OR for medically necessary treatment.  Surgeon: Cristino Martes, DMD  Assistant: Ruben Gottron, DA II and hospital staff.  Anesthesia: General  Procedure: The patient was brought into the operating room and placed on the table in a supine position.  General anesthesia was administered via nasal intubation.  The patient was prepped and draped in the usual manner for an intra-oral general dentistry procedure. The oropharynx was suctioned and a moistened oropharyngeal throat pack was placed.    A full intra-oral exam including all hard and soft tissues was performed.  Type of Exam: Recall   Soft Tissue Exam: Floor of the mouth: Normal Buccal mucosa: Normal Soft palate: Normal Hard palate: Normal Tongue: Normal Gingival: Normal Frenum: Normal  Hard tissue exam:  Present: # 3,4,6-15, 18-31 Missing: # 1,2,5,16,17,32 Un-erupted: N/A Radiographic findings decay: DO#3, MD#4, DO#13, D#19 Radiographic findings abscess: N/A  Full mouth series of digital radiographs taken and reviewed. A comprehensive treatment plan was developed.  Operative care was accomplished in a standard fashion using high/low speed drills with copious irrigation. The following teeth were restored using Amalgam: #3 - Coronal, smooth, dentin surface  #4 - Coronal, smooth, dentin surface #19- Coronal, smooth, dentin surface  The following teeth were restored using Resin-Modified Glass Ionomer: #30- Coronal, chewing, dentin surface #31-Coronal, chewing, dentin surface #13- Coronal, smooth, dentin surface  Upon completion of all procedures the oropharynx was irrigated of all debris. Mouth was suctioned dry and a posterior throat pack was carefully removed with constant suction. After spontaneous respirations the patient  was extubated and transported to the Post-Anesthesia care unit in awake but in a sedated condition. The patient tolerated the procedure well and without complications.  An explanation of procedures and extractions were given to the patient's care-giver.  Operative Procedures:  Full mouth debridement: Yes Extractions completed: N/A Amalgams: MODL#3, MOD#4, DO#19,  Composites/Glass Ionomers: DO#13, O#30, O#31 Fluoride varnish: Yes Gingivectomy: No Alveloplasty: No Crown: N/A Bridge: N/A  Postoperative Meds:  None  Postoperative Instructions: Extraction sheet signed and given to patient representative.    Cristino Martes, DMD

## 2020-04-21 NOTE — Transfer of Care (Signed)
Immediate Anesthesia Transfer of Care Note  Patient: Justin Kerr  Procedure(s) Performed: DENTAL RESTORATION/EXTRACTION WITH X-RAY fillings in teeth 3, 4, 13, 19, 30, and 31 WITH CLEANING AND FLOURIDE (N/A Mouth)  Patient Location: PACU  Anesthesia Type:General  Level of Consciousness: drowsy and patient cooperative  Airway & Oxygen Therapy: Patient Spontanous Breathing and Patient connected to face mask oxygen  Post-op Assessment: Report given to RN and Post -op Vital signs reviewed and stable  Post vital signs: Reviewed and stable  Last Vitals:  Vitals Value Taken Time  BP 132/65 04/21/20 1347  Temp    Pulse 71 04/21/20 1348  Resp 15 04/21/20 1348  SpO2 97 % 04/21/20 1348  Vitals shown include unvalidated device data.  Last Pain:  Vitals:   04/21/20 0832  PainSc: 3          Complications: No complications documented.

## 2020-04-21 NOTE — Brief Op Note (Signed)
04/21/2020  1:32 PM  PATIENT:  Justin Kerr  54 y.o. male  PRE-OPERATIVE DIAGNOSIS:  DENTAL CARIES  POST-OPERATIVE DIAGNOSIS:  DENTAL CARIES  PROCEDURE:  Procedure(s): DENTAL RESTORATION/EXTRACTION WITH X-RAY fillings in teeth 3, 4, 13, 19, 30, and 31 WITH CLEANING AND FLOURIDE (N/A)  SURGEON:  Surgeon(s) and Role:    * Cristino Martes, DMD - Primary  PHYSICIAN ASSISTANT:   ASSISTANTS: Ruben Gottron, DA II   ANESTHESIA:   none  EBL:  5 mL   BLOOD ADMINISTERED:none  DRAINS: none   LOCAL MEDICATIONS USED:  NONE  SPECIMEN:  No Specimen  DISPOSITION OF SPECIMEN:  N/A  COUNTS:  YES  TOURNIQUET:  * No tourniquets in log *  DICTATION: .Note written in EPIC  PLAN OF CARE: Discharge to home after PACU  PATIENT DISPOSITION:  ICU - extubated and stable.   Delay start of Pharmacological VTE agent (>24hrs) due to surgical blood loss or risk of bleeding: no

## 2020-04-21 NOTE — Anesthesia Procedure Notes (Signed)
Procedure Name: Intubation Date/Time: 04/21/2020 10:34 AM Performed by: Michele Rockers, CRNA Pre-anesthesia Checklist: Patient identified, Emergency Drugs available, Suction available and Patient being monitored Patient Re-evaluated:Patient Re-evaluated prior to induction Oxygen Delivery Method: Circle system utilized Preoxygenation: Pre-oxygenation with 100% oxygen Induction Type: IV induction Ventilation: Mask ventilation without difficulty Laryngoscope Size: Mac, 3 and Glidescope Grade View: Grade I Nasal Tubes: Nasal prep performed, Right and Nasal Rae Tube size: 7.5 mm Number of attempts: 1 Airway Equipment and Method: Stylet and Oral airway Placement Confirmation: ETT inserted through vocal cords under direct vision,  positive ETCO2 and breath sounds checked- equal and bilateral Tube secured with: Tape Dental Injury: Teeth and Oropharynx as per pre-operative assessment

## 2020-04-22 ENCOUNTER — Encounter (HOSPITAL_COMMUNITY): Payer: Self-pay | Admitting: Dentistry

## 2020-04-22 NOTE — Anesthesia Postprocedure Evaluation (Signed)
Anesthesia Post Note  Patient: Justin Kerr  Procedure(s) Performed: DENTAL RESTORATION/EXTRACTION WITH X-RAY fillings in teeth 3, 4, 13, 19, 30, and 31 WITH CLEANING AND FLOURIDE (N/A Mouth)     Patient location during evaluation: PACU Anesthesia Type: General Level of consciousness: sedated Pain management: pain level controlled Vital Signs Assessment: post-procedure vital signs reviewed and stable Respiratory status: spontaneous breathing and respiratory function stable Cardiovascular status: stable Postop Assessment: no apparent nausea or vomiting Anesthetic complications: no   No complications documented.  Last Vitals:  Vitals:   04/21/20 1345 04/21/20 1400  BP: 132/65 (!) 142/81  Pulse: 72 66  Resp: 11 13  Temp: 36.7 C 36.8 C  SpO2: 95% 94%    Last Pain:  Vitals:   04/21/20 1345  PainSc: 0-No pain                 Jerriah Ines DANIEL

## 2023-05-31 ENCOUNTER — Ambulatory Visit: Payer: Medicare Other | Attending: Internal Medicine | Admitting: Physical Therapy

## 2023-05-31 ENCOUNTER — Encounter: Payer: Self-pay | Admitting: Physical Therapy

## 2023-05-31 DIAGNOSIS — M6281 Muscle weakness (generalized): Secondary | ICD-10-CM | POA: Diagnosis present

## 2023-05-31 DIAGNOSIS — M5459 Other low back pain: Secondary | ICD-10-CM | POA: Diagnosis present

## 2023-05-31 DIAGNOSIS — R252 Cramp and spasm: Secondary | ICD-10-CM | POA: Diagnosis present

## 2023-05-31 NOTE — Therapy (Signed)
OUTPATIENT PHYSICAL THERAPY THORACOLUMBAR EVALUATION   Patient Name: Justin Kerr MRN: [id number] DOB:[birth year], 57 y.o., male Today's Date: 05/31/2023  END OF SESSION:  PT End of Session - 05/31/23 1218     Visit Number 1    Date for PT Re-Evaluation 07/26/23    Authorization Type Medicare, Medicaid + [employer]    Progress Note Due on Visit 10    PT Start Time 0930    PT Stop Time 1015    PT Time Calculation (min) 45 min    Activity Tolerance Patient tolerated treatment well    Behavior During Therapy WFL for tasks assessed/performed             Past Medical History:  Diagnosis Date   ADHD    Anxiety    Autism    Fragile X syndrome    Hypertension    Hypertriglyceridemia    Hypothyroidism    acquired   Lumbar spondylosis    Osteopenia    Thrombocytopenia (HCC)    Urge incontinence of urine    Vitamin D deficiency    Past Surgical History:  Procedure Laterality Date   DENTAL RESTORATION/EXTRACTION WITH X-RAY N/A 04/21/2020   Procedure: DENTAL RESTORATION/EXTRACTION WITH X-RAY fillings in teeth 3, 4, 13, 19, 30, and 31 WITH CLEANING AND FLOURIDE;  Surgeon: Cristino Martes, DMD;  Location: [facility] OR;  Service: Dentistry;  Laterality: N/A;   DENTAL SURGERY     wisdom teeth, dental   ELBOW SURGERY Left    There are no problems to display for this patient.   PCP: Curt Bears, MD   REFERRING PROVIDER: Leola Brazil, DO   REFERRING DIAG: [phone] (ICD-10-CM) - Spondylosis without myelopathy or radiculopathy, lumbar region   Rationale for Evaluation and Treatment: Rehabilitation  THERAPY DIAG:  Other low back pain - Plan: PT plan of care cert/re-cert  Cramp and spasm - Plan: PT plan of care cert/re-cert  Muscle weakness (generalized) - Plan: PT plan of care cert/re-cert  ONSET DATE: several months ago.   SUBJECTIVE:                                                                                                                                                                                            SUBJECTIVE STATEMENT: Caregiver reports that Justin Kerr started complaining about back pain last year playing bocce.  He also noticed recently that he was walking funny, wonders if it sciatica.  He did tell him that his R buttock hurts.   Justin Kerr reports it hurts when he bends over.    PERTINENT HISTORY:  ADHD, autism, fragile X, HTN, hypothyroidism,  osteopenia, thrombocytopenia,  lumbar spondylosis  PAIN:  Are you having pain? Yes: NPRS scale: 4 on FACE scale /10 Pain location: R buttock Pain description: ache Aggravating factors: standing for a long time Relieving factors: sitting down   PRECAUTIONS: None  RED FLAGS: None   WEIGHT BEARING RESTRICTIONS: No  FALLS:  Has patient fallen in last 6 months? No  LIVING ENVIRONMENT: Lives with: lives with their family and lives in an adult home Lives in: House/apartment Stairs: Yes: Internal: 16 steps; on right going up and on left going up and External: 3 steps; on right going up and on left going up Has following equipment at home: Grab bars  OCCUPATION: attends day program   PLOF: Independent with basic ADLs  PATIENT GOALS: decrease pain, walking better, get ready for bocce  NEXT MD VISIT: as needed  OBJECTIVE:   DIAGNOSTIC FINDINGS:  04/26/23 XR Lumbar spine IMPRESSION:  1. No acute fracture or traumatic malalignment.  2.  Mild multilevel degenerative disease most pronounced at L4-L5 and L5-S1.  3.  Moderate facet arthropathy at L4-L5 and L5-S1   PATIENT SURVEYS:  Modified Oswestry 13/50   COGNITION: Overall cognitive status: History of cognitive impairments - at baseline     SENSATION: Not tested  MUSCLE LENGTH: Hamstrings: moderate tightness bil, R>L  Quads: moderate tightness bil   POSTURE:  thoracic scoliosis with to left and compensatory R head tilt, weight shift to left in sitting , in prone, noted R tibia shorter than L  PALPATION: Tenderness lumbar spine  and R glutes  LUMBAR ROM:  very sudden movements, can touch toes, painful.   LOWER EXTREMITY ROM:      Tightness in L hip ER/IR compared to R hip.   LOWER EXTREMITY MMT:   functionally 4/5, however due to cognitive deficits difficulty following directions for MMT, can stand on toes, could not follow commands to walk on heels, 5/5 knee extension, 4/5 hip flexion.    LUMBAR SPECIAL TESTS:  Straight leg raise test: Negative  FUNCTIONAL TESTS:  5 times sit to stand: 21.9 seconds, intermittent UE assist Gait speed 0.94 m/s   GAIT: Distance walked: 100' Assistive device utilized: None Level of assistance: Complete Independence Comments: wide BOS, leans to left, turns LLE externally rotated, weight shift to left.    TODAY'S TREATMENT:                                                                                                                              DATE:   05/31/2023 EVAL Self Care: Findings, POC, discussion of LLD and how this can affect hip and back pain, and added 1/4" firm rubber full length insole to right shoe, precautions to remove if Carlon complains of increased LBP or discomfort in R foot or leg, noted improved gait following insertion of insole.     PATIENT EDUCATION:  Education details: see self care Person educated: Patient and Publishing rights manager Education method: Explanation Education comprehension:  verbalized understanding  HOME EXERCISE PROGRAM: TBD  ASSESSMENT:  CLINICAL IMPRESSION: Justin Kerr  is a 57 y.o. male who was seen today for physical therapy evaluation and treatment for low back pain.   His caregiver Justin Kerr reports he has been complaining of R sided buttock pain and intermittent back pain along with worsening gait for several months.  Noted today that Justin Kerr has thoracic scoliosis with L shift and R head tilt to compensate, leg length discrepancy of 1 cm (R leg shorter),  and tenderness in lumbar spine and R glutes/piriformis and decreased L hip ROM.    Justin Kerr also presents with impaired ambulation and decreased safety awareness impacting safe and independent functional mobility. Examination also reveals patient is at risk for falls and functional decline as evidenced by the following objective test measures: Gait speed 0.94 m/sec, (45m/sec is needed for community access) and 5x sit to stand of 21.9 sec (>15sec indicates increased risk for falls and decreased BLE power). Patient will benefit from skilled physical therapy services to help reach the maximal level of functional independence and mobility and decrease pain. Today added insole to R shoe to help compensate for LLD, with improved gait noted afterwards.  Justin Kerr was also given MHP to low back x 5 min when became slighly agitated after back examination while discussing POC with caregiver.     OBJECTIVE IMPAIRMENTS: Abnormal gait, decreased activity tolerance, decreased balance, decreased endurance, decreased knowledge of condition, difficulty walking, decreased ROM, decreased strength, hypomobility, increased fascial restrictions, increased muscle spasms, postural dysfunction, and pain.   ACTIVITY LIMITATIONS: carrying, lifting, bending, standing, sleeping, and locomotion level  PARTICIPATION LIMITATIONS: community activity and yard work  PERSONAL FACTORS: Time since onset of injury/illness/exacerbation and 3+ comorbidities: ADHD, autism, fragile X, HTN, hypothyroidism, osteopenia, thrombocytopenia,  lumbar spondylosis  are also affecting patient's functional outcome.   REHAB POTENTIAL: Good  CLINICAL DECISION MAKING: Evolving/moderate complexity  EVALUATION COMPLEXITY: Moderate   GOALS: Goals reviewed with patient? Yes  SHORT TERM GOALS: Target date: 06/14/2023   Patient will be independent with initial HEP.  Baseline: needs Goal status: INITIAL   LONG TERM GOALS: Target date: 07/26/2023   Patient will be independent with advanced/ongoing HEP to improve outcomes and carryover.   Baseline:  Goal status: INITIAL  2.  Patient will report 75% improvement in low back pain to improve QOL.  Baseline:  Goal status: INITIAL  3.  Patient will demonstrate full pain free lumbar ROM to perform ADLs.   Baseline: pain with bending over Goal status: INITIAL  4.  Patient will demonstrate improved functional strength as demonstrated by 5x STS < 17 seconds. Baseline: 21.9 seconds, intermittant UE support Goal status: INITIAL  5.  Patient will report at least 6 points improvement on modified Oswestry to demonstrate improved functional ability.  Baseline: 13/50 Goal status: INITIAL   6.  Patient will tolerate 1 hour  of standing to participate in special olympics activities. Baseline: increased pain with prolonged standing Goal status: INITIAL  7.   Patient will demonstrate at least 19/24 on DGI to decrease fall risk.  Baseline: NT Goal status: INITIAL   PLAN:  PT FREQUENCY: 1-2x/week  PT DURATION: 8 weeks  PLANNED INTERVENTIONS: Therapeutic exercises, Therapeutic activity, Neuromuscular re-education, Balance training, Gait training, Patient/Family education, Self Care, Joint mobilization, Stair training, Prosthetic training, Dry Needling, Electrical stimulation, Spinal manipulation, Spinal mobilization, Cryotherapy, Moist heat, Taping, Traction, Ultrasound, Manual therapy, and Re-evaluation.  PLAN FOR NEXT SESSION: DGI, assess response to heel insert, exercises for  core strengthening and HEP, manual therapy.     Jena Gauss, PT, DPT 05/31/2023, 12:40 PM

## 2023-06-07 ENCOUNTER — Ambulatory Visit: Payer: Medicare Other | Admitting: Physical Therapy

## 2023-06-09 ENCOUNTER — Encounter: Payer: Self-pay | Admitting: Physical Therapy

## 2023-06-09 ENCOUNTER — Ambulatory Visit: Payer: Medicare Other | Admitting: Physical Therapy

## 2023-06-09 DIAGNOSIS — M5459 Other low back pain: Secondary | ICD-10-CM

## 2023-06-09 DIAGNOSIS — M6281 Muscle weakness (generalized): Secondary | ICD-10-CM

## 2023-06-09 DIAGNOSIS — R252 Cramp and spasm: Secondary | ICD-10-CM

## 2023-06-09 NOTE — Therapy (Signed)
OUTPATIENT PHYSICAL THERAPY TREATMENT   Patient Name: Justin Kerr MRN: [id number] DOB:Mar 02, 1966, 57 y.o., male Today's Date: 06/09/2023  END OF SESSION:  PT End of Session - 06/09/23 0810     Visit Number 2    Date for PT Re-Evaluation 07/26/23    Authorization Type Medicare, Medicaid + [employer] for [employer]    Progress Note Due on Visit 10    PT Start Time 0803    PT Stop Time 0846    PT Time Calculation (min) 43 min    Activity Tolerance Patient tolerated treatment well    Behavior During Therapy WFL for tasks assessed/performed             Past Medical History:  Diagnosis Date   ADHD    Anxiety    Autism    Fragile X syndrome    Hypertension    Hypertriglyceridemia    Hypothyroidism    acquired   Lumbar spondylosis    Osteopenia    Thrombocytopenia (HCC)    Urge incontinence of urine    Vitamin D deficiency    Past Surgical History:  Procedure Laterality Date   DENTAL RESTORATION/EXTRACTION WITH X-RAY N/A 04/21/2020   Procedure: DENTAL RESTORATION/EXTRACTION WITH X-RAY fillings in teeth 3, 4, 13, 19, 30, and 31 WITH CLEANING AND FLOURIDE;  Surgeon: Justin Kerr, DMD;  Location: [facility] OR;  Service: Dentistry;  Laterality: N/A;   DENTAL SURGERY     wisdom teeth, dental   ELBOW SURGERY Left    There are no problems to display for this patient.   PCP: Justin Bears, MD   REFERRING PROVIDER: Leola Brazil, DO   REFERRING DIAG: [phone] (ICD-10-CM) - Spondylosis without myelopathy or radiculopathy, lumbar region   Rationale for Evaluation and Treatment: Rehabilitation  THERAPY DIAG:  Other low back pain  Cramp and spasm  Muscle weakness (generalized)  ONSET DATE: several months ago.   SUBJECTIVE:                                                                                                                                                                                           SUBJECTIVE STATEMENT: Justin Kerr accompanied by caregiver Justin Kerr.   He denies back pain today.  He was sorry to miss Wednesday.  Justin Kerr reports he had fecal accident on way to therapy, but had not been take to bathroom before leaving day program as requested.    PERTINENT HISTORY:  ADHD, autism, fragile X, HTN, hypothyroidism, osteopenia, thrombocytopenia,  lumbar spondylosis  PAIN:  Are you having pain? Yes: NPRS scale: 0 on FACE scale /10 Pain location: R buttock Pain  description: ache Aggravating factors: standing for a long time Relieving factors: sitting down   PRECAUTIONS: None  RED FLAGS: None   WEIGHT BEARING RESTRICTIONS: No  FALLS:  Has patient fallen in last 6 months? No  LIVING ENVIRONMENT: Lives with: lives with their family and lives in an adult home Lives in: House/apartment Stairs: Yes: Internal: 16 steps; on right going up and on left going up and External: 3 steps; on right going up and on left going up Has following equipment at home: Grab bars  OCCUPATION: attends day program   PLOF: Independent with basic ADLs  PATIENT GOALS: decrease pain, walking better, get ready for bocce  NEXT MD VISIT: as needed  OBJECTIVE:   DIAGNOSTIC FINDINGS:  04/26/23 XR Lumbar spine IMPRESSION:  1. No acute fracture or traumatic malalignment.  2.  Mild multilevel degenerative disease most pronounced at L4-L5 and L5-S1.  3.  Moderate facet arthropathy at L4-L5 and L5-S1   PATIENT SURVEYS:  Modified Oswestry 13/50   COGNITION: Overall cognitive status: History of cognitive impairments - at baseline     SENSATION: Not tested  MUSCLE LENGTH: Hamstrings: moderate tightness bil, R>L  Quads: moderate tightness bil   POSTURE:  thoracic scoliosis with to left and compensatory R head tilt, weight shift to left in sitting , in prone, noted R tibia shorter than L  PALPATION: Tenderness lumbar spine and R glutes  LUMBAR ROM:  very sudden movements, can touch toes, painful.   LOWER EXTREMITY ROM:      Tightness in L hip ER/IR compared  to R hip.   LOWER EXTREMITY MMT:   functionally 4/5, however due to cognitive deficits difficulty following directions for MMT, can stand on toes, could not follow commands to walk on heels, 5/5 knee extension, 4/5 hip flexion.    LUMBAR SPECIAL TESTS:  Straight leg raise test: Negative  FUNCTIONAL TESTS:  5 times sit to stand: 21.9 seconds, intermittent UE assist Gait speed 0.94 m/s   GAIT: Distance walked: 100' Assistive device utilized: None Level of assistance: Complete Independence Comments: wide BOS, leans to left, turns LLE externally rotated, weight shift to left.    TODAY'S TREATMENT:                                                                                                                              DATE:   06/09/23 Therapeutic Exercise: to improve strength and mobility.  Demo, verbal and tactile cues throughout for technique. Nustep L5 x 7 min  Prone leg extensions x 10 each side minA needed Supine PPT Supine bridges x 5, x 10 with cues Standing lateral corrections 2 x 10 with tactile cues Seated R QL stretch over bolster  Seated twists 2 x 10 - R preference Manual Therapy: to decrease muscle spasm and pain and improve mobility IASTM with foam roller to lumbar erector spinae and glutes, STM/TPR to bil QL  05/31/2023 EVAL Self Care: Findings, POC, discussion of  LLD and how this can affect hip and back pain, and added 1/4" firm rubber full length insole to right shoe, precautions to remove if Justin Kerr complains of increased LBP or discomfort in R foot or leg, noted improved gait following insertion of insole.     PATIENT EDUCATION:  Education details: Initial HEP Person educated: Patient and Publishing rights manager Education method: Explanation, Demonstration, Tactile cues, Verbal cues, and Handouts Education comprehension: verbalized understanding, returned demonstration, tactile cues required, and needs further education  HOME EXERCISE PROGRAM: Access Code:  [email or web address] URL: [email or web address] Date: 06/09/2023 Prepared by: Justin Kerr   Exercises - Left Standing Lateral Shift Correction at Wall - Repetitions  - 1 x daily - 7 x weekly - 2 sets - 10 reps - 3 sec hold - Supine Bridge  - 1 x daily - 7 x weekly - 2 sets - 10 reps - 3 sec hold  ASSESSMENT:  CLINICAL IMPRESSION: BROADY ALMAS reported no pain today, stating he was "happy."  He did have accident Wednesday on way to therapy, discussed that if he had change in bowel/bladder control this may be red flag with his back pain with caregiver, but Karma does have history of intermittent accidents with excitement.   He tolerated exercises well today but needed a lot of tactile cuing to complete, also noted facial grimacing with exercises but denied pain, said he was smiling.  Given initial HEP with exercises that seemed to tolerate best, noted improved posture at end of session.  Gentle STM at end of session, noted tenderness in R QL.  Justin Kerr continues to demonstrate potential for improvement and would benefit from continued skilled therapy to address impairments.      OBJECTIVE IMPAIRMENTS: Abnormal gait, decreased activity tolerance, decreased balance, decreased endurance, decreased knowledge of condition, difficulty walking, decreased ROM, decreased strength, hypomobility, increased fascial restrictions, increased muscle spasms, postural dysfunction, and pain.   ACTIVITY LIMITATIONS: carrying, lifting, bending, standing, sleeping, and locomotion level  PARTICIPATION LIMITATIONS: community activity and yard work  PERSONAL FACTORS: Time since onset of injury/illness/exacerbation and 3+ comorbidities: ADHD, autism, fragile X, HTN, hypothyroidism, osteopenia, thrombocytopenia,  lumbar spondylosis  are also affecting patient's functional outcome.   REHAB POTENTIAL: Good  CLINICAL DECISION MAKING: Evolving/moderate complexity  EVALUATION COMPLEXITY:  Moderate   GOALS: Goals reviewed with patient? Yes  SHORT TERM GOALS: Target date: 06/14/2023   Patient will be independent with initial HEP.  Baseline: needs Goal status: IN PROGRESS 06/09/23 - HEP given.    LONG TERM GOALS: Target date: 07/26/2023   Patient will be independent with advanced/ongoing HEP to improve outcomes and carryover.  Baseline:  Goal status: IN PROGRESS  2.  Patient will report 75% improvement in low back pain to improve QOL.  Baseline:  Goal status: IN PROGRESS  3.  Patient will demonstrate full pain free lumbar ROM to perform ADLs.   Baseline: pain with bending over Goal status: IN PROGRESS  4.  Patient will demonstrate improved functional strength as demonstrated by 5x STS < 17 seconds. Baseline: 21.9 seconds, intermittant UE support Goal status: IN PROGRESS  5.  Patient will report at least 6 points improvement on modified Oswestry to demonstrate improved functional ability.  Baseline: 13/50 Goal status: IN PROGRESS   6.  Patient will tolerate 1 hour  of standing to participate in special olympics activities. Baseline: increased pain with prolonged standing Goal status: IN PROGRESS  7.   Patient will demonstrate at least 19/24 on DGI to decrease fall risk.  Baseline: NT Goal status: IN PROGRESS   PLAN:  PT FREQUENCY: 1-2x/week  PT DURATION: 8 weeks  PLANNED INTERVENTIONS: Therapeutic exercises, Therapeutic activity, Neuromuscular re-education, Balance training, Gait training, Patient/Family education, Self Care, Joint mobilization, Stair training, Prosthetic training, Dry Needling, Electrical stimulation, Spinal manipulation, Spinal mobilization, Cryotherapy, Moist heat, Taping, Traction, Ultrasound, Manual therapy, and Re-evaluation.  PLAN FOR NEXT SESSION: DGI, assess response to heel insert, exercises for core strengthening and HEP, manual therapy.     Jena Gauss, PT, DPT 06/09/2023, 12:31 PM

## 2023-06-14 ENCOUNTER — Ambulatory Visit: Payer: Medicare Other | Admitting: Physical Therapy

## 2023-06-14 ENCOUNTER — Encounter: Payer: Self-pay | Admitting: Physical Therapy

## 2023-06-14 DIAGNOSIS — R252 Cramp and spasm: Secondary | ICD-10-CM

## 2023-06-14 DIAGNOSIS — M6281 Muscle weakness (generalized): Secondary | ICD-10-CM

## 2023-06-14 DIAGNOSIS — M5459 Other low back pain: Secondary | ICD-10-CM

## 2023-06-14 NOTE — Therapy (Signed)
OUTPATIENT PHYSICAL THERAPY TREATMENT   Patient Name: Justin Kerr MRN: [id number] DOB:14-Mar-1966, 57 y.o., male Today's Date: 06/14/2023  END OF SESSION:  PT End of Session - 06/14/23 1409     Visit Number 3    Date for PT Re-Evaluation 07/26/23    Authorization Type Medicare, Medicaid + [employer]    Progress Note Due on Visit 10    PT Start Time 1404    PT Stop Time 1448    PT Time Calculation (min) 44 min    Activity Tolerance Patient tolerated treatment well    Behavior During Therapy WFL for tasks assessed/performed             Past Medical History:  Diagnosis Date   ADHD    Anxiety    Autism    Fragile X syndrome    Hypertension    Hypertriglyceridemia    Hypothyroidism    acquired   Lumbar spondylosis    Osteopenia    Thrombocytopenia (HCC)    Urge incontinence of urine    Vitamin D deficiency    Past Surgical History:  Procedure Laterality Date   DENTAL RESTORATION/EXTRACTION WITH X-RAY N/A 04/21/2020   Procedure: DENTAL RESTORATION/EXTRACTION WITH X-RAY fillings in teeth 3, 4, 13, 19, 30, and 31 WITH CLEANING AND FLOURIDE;  Surgeon: Cristino Martes, DMD;  Location: [facility] OR;  Service: Dentistry;  Laterality: N/A;   DENTAL SURGERY     wisdom teeth, dental   ELBOW SURGERY Left    There are no problems to display for this patient.   PCP: Curt Bears, MD   REFERRING PROVIDER: Leola Brazil, DO   REFERRING DIAG: [phone] (ICD-10-CM) - Spondylosis without myelopathy or radiculopathy, lumbar region   Rationale for Evaluation and Treatment: Rehabilitation  THERAPY DIAG:  Other low back pain  Cramp and spasm  Muscle weakness (generalized)  ONSET DATE: several months ago.   SUBJECTIVE:                                                                                                                                                                                           SUBJECTIVE STATEMENT: Justin Kerr accompanied by caregiver [name].   He reports Rage was very sore after PT last session.    Reported 6/10 pain yesterday and this morning.    PERTINENT HISTORY:  ADHD, autism, fragile X, HTN, hypothyroidism, osteopenia, thrombocytopenia,  lumbar spondylosis  PAIN:  Are you having pain? Yes: NPRS scale: 2 on FACE scale /10 Pain location: R buttock Pain description: ache Aggravating factors: standing for a long time Relieving factors: sitting down   PRECAUTIONS:  None  RED FLAGS: None   WEIGHT BEARING RESTRICTIONS: No  FALLS:  Has patient fallen in last 6 months? No  LIVING ENVIRONMENT: Lives with: lives with their family and lives in an adult home Lives in: House/apartment Stairs: Yes: Internal: 16 steps; on right going up and on left going up and External: 3 steps; on right going up and on left going up Has following equipment at home: Grab bars  OCCUPATION: attends day program   PLOF: Independent with basic ADLs  PATIENT GOALS: decrease pain, walking better, get ready for bocce  NEXT MD VISIT: as needed  OBJECTIVE:   DIAGNOSTIC FINDINGS:  04/26/23 XR Lumbar spine IMPRESSION:  1. No acute fracture or traumatic malalignment.  2.  Mild multilevel degenerative disease most pronounced at L4-L5 and L5-S1.  3.  Moderate facet arthropathy at L4-L5 and L5-S1   PATIENT SURVEYS:  Modified Oswestry 13/50   COGNITION: Overall cognitive status: History of cognitive impairments - at baseline     SENSATION: Not tested  MUSCLE LENGTH: Hamstrings: moderate tightness bil, R>L  Quads: moderate tightness bil   POSTURE:  thoracic scoliosis with to left and compensatory R head tilt, weight shift to left in sitting , in prone, noted R tibia shorter than L  PALPATION: Tenderness lumbar spine and R glutes  LUMBAR ROM:  very sudden movements, can touch toes, painful.   LOWER EXTREMITY ROM:      Tightness in L hip ER/IR compared to R hip.   LOWER EXTREMITY MMT:   functionally 4/5, however due to cognitive deficits  difficulty following directions for MMT, can stand on toes, could not follow commands to walk on heels, 5/5 knee extension, 4/5 hip flexion.    LUMBAR SPECIAL TESTS:  Straight leg raise test: Negative  FUNCTIONAL TESTS:  5 times sit to stand: 21.9 seconds, intermittent UE assist Gait speed 0.94 m/s   GAIT: Distance walked: 100' Assistive device utilized: None Level of assistance: Complete Independence Comments: wide BOS, leans to left, turns LLE externally rotated, weight shift to left.    TODAY'S TREATMENT:                                                                                                                              DATE:   06/14/23 Therapeutic Exercise: to improve strength and mobility.  Demo, verbal and tactile cues throughout for technique. Nustep L5 x 7 min Standing lateral corrections 2 x 10  Seated twists to side of preference (L) 2 x 10  Seated QL stretch R side 3 x 30 sec hold Trialed piriformis stretches in sitting - did not like stretching L side, better tolerance on R Manual Therapy: to decrease muscle spasm and pain and improve mobility IASTM with foam roller to lumbar erector spinae and glutes, STM/TPR to R QL  06/09/23 Therapeutic Exercise: to improve strength and mobility.  Demo, verbal and tactile cues throughout for technique. Nustep L5 x 7 min  Prone  leg extensions x 10 each side minA needed Supine PPT Supine bridges x 5, x 10 with cues Standing lateral corrections 2 x 10 with tactile cues Seated R QL stretch over bolster  Seated twists 2 x 10 - R preference Manual Therapy: to decrease muscle spasm and pain and improve mobility IASTM with foam roller to lumbar erector spinae and glutes, STM/TPR to bil QL  05/31/2023 EVAL Self Care: Findings, POC, discussion of LLD and how this can affect hip and back pain, and added 1/4" firm rubber full length insole to right shoe, precautions to remove if Rushi complains of increased LBP or discomfort in R foot  or leg, noted improved gait following insertion of insole.     PATIENT EDUCATION:  Education details: HEP update Person educated: Patient and Publishing rights manager Education method: Explanation, Demonstration, Tactile cues, Verbal cues, and Handouts Education comprehension: verbalized understanding, returned demonstration, tactile cues required, and needs further education  HOME EXERCISE PROGRAM: Access Code: [email or web address] URL: [email or web address] Date: 06/14/2023 Prepared by: Harrie Foreman  Exercises - Left Standing Lateral Shift Correction at Wall - Repetitions  - 1 x daily - 7 x weekly - 2 sets - 10 reps - 3 sec hold - Supine Bridge  - 1 x daily - 7 x weekly - 2 sets - 10 reps - 3 sec hold - Seated Trunk Rotation  - 2 x daily - 7 x weekly - 1 sets - 10 reps - Seated Quadratus Lumborum Stretch in Chair  - 2 x daily - 7 x weekly - 1 sets - 10 reps  ASSESSMENT:  CLINICAL IMPRESSION: Justin Kerr caregiver reported he did have soreness after last session, and was complaining of pain today.  Colon needed a lot of demo and correction to complete exercises correctly, and had to be constantly monitored for tolerance for exercises.  Noted R QL still very tender, reacted today when reaching forward to point to pain scale here, but reported decreased pain after manual.  Updated HEP, with instructions to focus on side of preference "yummy side" to shorten tight muscles as Pasqual has difficulty with communication and regulation and responds better to this method.   Justin Kerr continues to demonstrate potential for improvement and would benefit from continued skilled therapy to address impairments.      OBJECTIVE IMPAIRMENTS: Abnormal gait, decreased activity tolerance, decreased balance, decreased endurance, decreased knowledge of condition, difficulty walking, decreased ROM, decreased strength, hypomobility, increased fascial restrictions, increased muscle spasms, postural  dysfunction, and pain.   ACTIVITY LIMITATIONS: carrying, lifting, bending, standing, sleeping, and locomotion level  PARTICIPATION LIMITATIONS: community activity and yard work  PERSONAL FACTORS: Time since onset of injury/illness/exacerbation and 3+ comorbidities: ADHD, autism, fragile X, HTN, hypothyroidism, osteopenia, thrombocytopenia,  lumbar spondylosis  are also affecting patient's functional outcome.   REHAB POTENTIAL: Good  CLINICAL DECISION MAKING: Evolving/moderate complexity  EVALUATION COMPLEXITY: Moderate   GOALS: Goals reviewed with patient? Yes  SHORT TERM GOALS: Target date: 06/14/2023   Patient will be independent with initial HEP.  Baseline: needs Goal status: IN PROGRESS 06/09/23 - HEP given.    LONG TERM GOALS: Target date: 07/26/2023   Patient will be independent with advanced/ongoing HEP to improve outcomes and carryover.  Baseline:  Goal status: IN PROGRESS  2.  Patient will report 75% improvement in low back pain to improve QOL.  Baseline:  Goal status: IN PROGRESS  3.  Patient will demonstrate full pain free lumbar ROM to perform ADLs.   Baseline: pain with  bending over Goal status: IN PROGRESS  4.  Patient will demonstrate improved functional strength as demonstrated by 5x STS < 17 seconds. Baseline: 21.9 seconds, intermittant UE support Goal status: IN PROGRESS  5.  Patient will report at least 6 points improvement on modified Oswestry to demonstrate improved functional ability.  Baseline: 13/50 Goal status: IN PROGRESS   6.  Patient will tolerate 1 hour  of standing to participate in special olympics activities. Baseline: increased pain with prolonged standing Goal status: IN PROGRESS  7.   Patient will demonstrate at least 19/24 on DGI to decrease fall risk.  Baseline: NT Goal status: IN PROGRESS   PLAN:  PT FREQUENCY: 1-2x/week  PT DURATION: 8 weeks  PLANNED INTERVENTIONS: Therapeutic exercises, Therapeutic activity,  Neuromuscular re-education, Balance training, Gait training, Patient/Family education, Self Care, Joint mobilization, Stair training, Prosthetic training, Dry Needling, Electrical stimulation, Spinal manipulation, Spinal mobilization, Cryotherapy, Moist heat, Taping, Traction, Ultrasound, Manual therapy, and Re-evaluation.  PLAN FOR NEXT SESSION: DGI, assess response to heel insert, exercises for core strengthening and HEP, manual therapy.     Jena Gauss, PT, DPT 06/14/2023, 5:56 PM

## 2023-06-19 ENCOUNTER — Encounter: Payer: Self-pay | Admitting: Physical Therapy

## 2023-06-19 ENCOUNTER — Ambulatory Visit: Payer: Medicare Other | Admitting: Physical Therapy

## 2023-06-19 DIAGNOSIS — M5459 Other low back pain: Secondary | ICD-10-CM

## 2023-06-19 DIAGNOSIS — M6281 Muscle weakness (generalized): Secondary | ICD-10-CM

## 2023-06-19 DIAGNOSIS — R252 Cramp and spasm: Secondary | ICD-10-CM

## 2023-06-19 NOTE — Therapy (Signed)
OUTPATIENT PHYSICAL THERAPY TREATMENT   Patient Name: Justin Kerr MRN: [id number] DOB:[birth year], 57 y.o., male Today's Date: 06/19/2023  END OF SESSION:  PT End of Session - 06/19/23 1537     Visit Number 4    Date for PT Re-Evaluation 07/26/23    Authorization Type Medicare, Medicaid + T[employer] for Life    Progress Note Due on Visit 10    PT Start Time 1535    PT Stop Time 1613    PT Time Calculation (min) 38 min    Activity Tolerance Patient tolerated treatment well    Behavior During Therapy WFL for tasks assessed/performed             Past Medical History:  Diagnosis Date   ADHD    Anxiety    Autism    Fragile X syndrome    Hypertension    Hypertriglyceridemia    Hypothyroidism    acquired   Lumbar spondylosis    Osteopenia    Thrombocytopenia (HCC)    Urge incontinence of urine    Vitamin D deficiency    Past Surgical History:  Procedure Laterality Date   DENTAL RESTORATION/EXTRACTION WITH X-RAY N/A 04/21/2020   Procedure: DENTAL RESTORATION/EXTRACTION WITH X-RAY fillings in teeth 3, 4, 13, 19, 30, and 31 WITH CLEANING AND FLOURIDE;  Surgeon: Cristino Martes, DMD;  Location: [facility] OR;  Service: Dentistry;  Laterality: N/A;   DENTAL SURGERY     wisdom teeth, dental   ELBOW SURGERY Left    There are no problems to display for this patient.   PCP: Curt Bears, MD   REFERRING PROVIDER: Leola Brazil, DO   REFERRING DIAG: [phone] (ICD-10-CM) - Spondylosis without myelopathy or radiculopathy, lumbar region   Rationale for Evaluation and Treatment: Rehabilitation  THERAPY DIAG:  Other low back pain  Cramp and spasm  Muscle weakness (generalized)  ONSET DATE: several months ago.   SUBJECTIVE:                                                                                                                                                                                           SUBJECTIVE STATEMENT: Justin Kerr accompanied by caregiver Justin Kerr.   Tabius denies pain   PERTINENT HISTORY:  ADHD, autism, fragile X, HTN, hypothyroidism, osteopenia, thrombocytopenia,  lumbar spondylosis  PAIN:  Are you having pain? Yes: NPRS scale: 2 on FACE scale /10 Pain location: R buttock Pain description: ache Aggravating factors: standing for a long time Relieving factors: sitting down   PRECAUTIONS: None  RED FLAGS: None   WEIGHT BEARING RESTRICTIONS: No  FALLS:  Has patient fallen in  last 6 months? No  LIVING ENVIRONMENT: Lives with: lives with their family and lives in an adult home Lives in: House/apartment Stairs: Yes: Internal: 16 steps; on right going up and on left going up and External: 3 steps; on right going up and on left going up Has following equipment at home: Grab bars  OCCUPATION: attends day program   PLOF: Independent with basic ADLs  PATIENT GOALS: decrease pain, walking better, get ready for bocce  NEXT MD VISIT: as needed  OBJECTIVE:   DIAGNOSTIC FINDINGS:  04/26/23 XR Lumbar spine IMPRESSION:  1. No acute fracture or traumatic malalignment.  2.  Mild multilevel degenerative disease most pronounced at L4-L5 and L5-S1.  3.  Moderate facet arthropathy at L4-L5 and L5-S1   PATIENT SURVEYS:  Modified Oswestry 13/50   COGNITION: Overall cognitive status: History of cognitive impairments - at baseline     SENSATION: Not tested  MUSCLE LENGTH: Hamstrings: moderate tightness bil, R>L  Quads: moderate tightness bil   POSTURE:  thoracic scoliosis with to left and compensatory R head tilt, weight shift to left in sitting , in prone, noted R tibia shorter than L  PALPATION: Tenderness lumbar spine and R glutes  LUMBAR ROM:  very sudden movements, can touch toes, painful.   LOWER EXTREMITY ROM:      Tightness in L hip ER/IR compared to R hip.   LOWER EXTREMITY MMT:   functionally 4/5, however due to cognitive deficits difficulty following directions for MMT, can stand on toes, could not follow commands  to walk on heels, 5/5 knee extension, 4/5 hip flexion.    LUMBAR SPECIAL TESTS:  Straight leg raise test: Negative  FUNCTIONAL TESTS:  5 times sit to stand: 21.9 seconds, intermittent UE assist Gait speed 0.94 m/s   GAIT: Distance walked: 100' Assistive device utilized: None Level of assistance: Complete Independence Comments: wide BOS, leans to left, turns LLE externally rotated, weight shift to left.    TODAY'S TREATMENT:                                                                                                                              DATE:   06/19/23  Therapeutic Exercise: to improve strength and mobility.  Demo, verbal and tactile cues throughout for technique. Nustep L5 x 7 min Supine LTR to side of preference 2 x 10  Bridges 2 x 10  Prone knee bends x 10  Attempted legs extensions - discontinued due to excessive compensations and assistance needed Manual Therapy: to decrease muscle spasm and pain and improve mobility IASTM with foam roller to lumbar erector spinae and glutes, STM/TPR to R QL  06/14/23 Therapeutic Exercise: to improve strength and mobility.  Demo, verbal and tactile cues throughout for technique. Nustep L5 x 7 min Standing lateral corrections 2 x 10  Seated twists to side of preference (L) 2 x 10  Seated QL stretch R side 3 x 30 sec hold Trialed piriformis  stretches in sitting - did not like stretching L side, better tolerance on R Manual Therapy: to decrease muscle spasm and pain and improve mobility IASTM with foam roller to lumbar erector spinae and glutes, STM/TPR to R QL  06/09/23 Therapeutic Exercise: to improve strength and mobility.  Demo, verbal and tactile cues throughout for technique. Nustep L5 x 7 min  Prone leg extensions x 10 each side minA needed Supine PPT Supine bridges x 5, x 10 with cues Standing lateral corrections 2 x 10 with tactile cues Seated R QL stretch over bolster  Seated twists 2 x 10 - R preference Manual  Therapy: to decrease muscle spasm and pain and improve mobility IASTM with foam roller to lumbar erector spinae and glutes, STM/TPR to bil QL   PATIENT EDUCATION:  Education details: HEP review Person educated: Patient and Publishing rights manager Education method: Explanation, Demonstration, Tactile cues, and Verbal cues Education comprehension: verbalized understanding, returned demonstration, tactile cues required, and needs further education  HOME EXERCISE PROGRAM: Access Code: [email or web address] URL: [email or web address] Date: 06/14/2023 Prepared by: Harrie Foreman  Exercises - Left Standing Lateral Shift Correction at Wall - Repetitions  - 1 x daily - 7 x weekly - 2 sets - 10 reps - 3 sec hold - Supine Bridge  - 1 x daily - 7 x weekly - 2 sets - 10 reps - 3 sec hold - Seated Trunk Rotation  - 2 x daily - 7 x weekly - 1 sets - 10 reps - Seated Quadratus Lumborum Stretch in Chair  - 2 x daily - 7 x weekly - 1 sets - 10 reps  ASSESSMENT:  CLINICAL IMPRESSION: Justin Kerr caregiver reports he was visiting family this weekend, so off his routine, so [place]on did have a little more difficulty with following directions today, however he responded well to "thumbs up" and "thumbs down" for exercise tolerance.  Consistently denied pain despite facial grimacing with leg extensions, but had to discontinue as unable to sequence exercise correctly despite max cues.  Still tight along R QL.   Justin Kerr continues to demonstrate potential for improvement and would benefit from continued skilled therapy to address impairments.      OBJECTIVE IMPAIRMENTS: Abnormal gait, decreased activity tolerance, decreased balance, decreased endurance, decreased knowledge of condition, difficulty walking, decreased ROM, decreased strength, hypomobility, increased fascial restrictions, increased muscle spasms, postural dysfunction, and pain.   ACTIVITY LIMITATIONS: carrying, lifting, bending, standing,  sleeping, and locomotion level  PARTICIPATION LIMITATIONS: community activity and yard work  PERSONAL FACTORS: Time since onset of injury/illness/exacerbation and 3+ comorbidities: ADHD, autism, fragile X, HTN, hypothyroidism, osteopenia, thrombocytopenia,  lumbar spondylosis  are also affecting patient's functional outcome.   REHAB POTENTIAL: Good  CLINICAL DECISION MAKING: Evolving/moderate complexity  EVALUATION COMPLEXITY: Moderate   GOALS: Goals reviewed with patient? Yes  SHORT TERM GOALS: Target date: 06/14/2023   Patient will be independent with initial HEP.  Baseline: needs Goal status: MET 06/09/23 - HEP given.  06/19/23- met   LONG TERM GOALS: Target date: 07/26/2023   Patient will be independent with advanced/ongoing HEP to improve outcomes and carryover.  Baseline:  Goal status: IN PROGRESS  2.  Patient will report 75% improvement in low back pain to improve QOL.  Baseline:  Goal status: IN PROGRESS  3.  Patient will demonstrate full pain free lumbar ROM to perform ADLs.   Baseline: pain with bending over Goal status: IN PROGRESS  4.  Patient will demonstrate improved functional strength as demonstrated by  5x STS < 17 seconds. Baseline: 21.9 seconds, intermittant UE support Goal status: IN PROGRESS  5.  Patient will report at least 6 points improvement on modified Oswestry to demonstrate improved functional ability.  Baseline: 13/50 Goal status: IN PROGRESS   6.  Patient will tolerate 1 hour  of standing to participate in special olympics activities. Baseline: increased pain with prolonged standing Goal status: IN PROGRESS  7.   Patient will demonstrate at least 19/24 on DGI to decrease fall risk.  Baseline: NT Goal status: IN PROGRESS   PLAN:  PT FREQUENCY: 1-2x/week  PT DURATION: 8 weeks  PLANNED INTERVENTIONS: Therapeutic exercises, Therapeutic activity, Neuromuscular re-education, Balance training, Gait training, Patient/Family education, Self  Care, Joint mobilization, Stair training, Prosthetic training, Dry Needling, Electrical stimulation, Spinal manipulation, Spinal mobilization, Cryotherapy, Moist heat, Taping, Traction, Ultrasound, Manual therapy, and Re-evaluation.  PLAN FOR NEXT SESSION: DGI, assess response to heel insert, exercises for core strengthening and HEP, manual therapy.     Jena Gauss, PT, DPT 06/19/2023, 5:18 PM

## 2023-06-21 ENCOUNTER — Encounter: Payer: TRICARE For Life (TFL) | Admitting: Physical Therapy

## 2023-06-26 ENCOUNTER — Encounter: Payer: Self-pay | Admitting: Physical Therapy

## 2023-06-26 ENCOUNTER — Ambulatory Visit: Payer: Medicare Other | Attending: Internal Medicine | Admitting: Physical Therapy

## 2023-06-26 DIAGNOSIS — R252 Cramp and spasm: Secondary | ICD-10-CM | POA: Insufficient documentation

## 2023-06-26 DIAGNOSIS — M6281 Muscle weakness (generalized): Secondary | ICD-10-CM | POA: Insufficient documentation

## 2023-06-26 DIAGNOSIS — M5459 Other low back pain: Secondary | ICD-10-CM | POA: Insufficient documentation

## 2023-06-26 NOTE — Therapy (Signed)
OUTPATIENT PHYSICAL THERAPY TREATMENT   Patient Name: Justin Kerr MRN: [id number] DOB:November 18, 1965, 57 y.o., male Today's Date: 06/26/2023  END OF SESSION:  PT End of Session - 06/26/23 1408     Visit Number 5    Date for PT Re-Evaluation 07/26/23    Authorization Type Medicare, Medicaid + [employer]    Progress Note Due on Visit 10    PT Start Time 1406    PT Stop Time 1445    PT Time Calculation (min) 39 min    Activity Tolerance Patient tolerated treatment well    Behavior During Therapy WFL for tasks assessed/performed             Past Medical History:  Diagnosis Date   ADHD    Anxiety    Autism    Fragile X syndrome    Hypertension    Hypertriglyceridemia    Hypothyroidism    acquired   Lumbar spondylosis    Osteopenia    Thrombocytopenia (HCC)    Urge incontinence of urine    Vitamin D deficiency    Past Surgical History:  Procedure Laterality Date   DENTAL RESTORATION/EXTRACTION WITH X-RAY N/A 04/21/2020   Procedure: DENTAL RESTORATION/EXTRACTION WITH X-RAY fillings in teeth 3, 4, 13, 19, 30, and 31 WITH CLEANING AND FLOURIDE;  Surgeon: Cristino Martes, DMD;  Location: [facility] OR;  Service: Dentistry;  Laterality: N/A;   DENTAL SURGERY     wisdom teeth, dental   ELBOW SURGERY Left    There are no problems to display for this patient.   PCP: Curt Bears, MD   REFERRING PROVIDER: Leola Brazil, DO   REFERRING DIAG: [phone] (ICD-10-CM) - Spondylosis without myelopathy or radiculopathy, lumbar region   Rationale for Evaluation and Treatment: Rehabilitation  THERAPY DIAG:  Other low back pain  Cramp and spasm  Muscle weakness (generalized)  ONSET DATE: several months ago.   SUBJECTIVE:                                                                                                                                                                                           SUBJECTIVE STATEMENT: Justin Kerr accompanied by caregiver [name].   [name] reports Justin Kerr has been doing well, not really complaining of pain, has just been giving him an ibuprofen daily more as preventative.  Did exercises at beach in pool  Dreon selected 2/10 on FACES scale with a thumbs up today.    PERTINENT HISTORY:  ADHD, autism, fragile X, HTN, hypothyroidism, osteopenia, thrombocytopenia,  lumbar spondylosis  PAIN:  Are you having pain? Yes: NPRS scale: 2 on FACE scale /  10 Pain location: R buttock Pain description: ache Aggravating factors: standing for a long time Relieving factors: sitting down   PRECAUTIONS: None  RED FLAGS: None   WEIGHT BEARING RESTRICTIONS: No  FALLS:  Has patient fallen in last 6 months? No  LIVING ENVIRONMENT: Lives with: lives with their family and lives in an adult home Lives in: House/apartment Stairs: Yes: Internal: 16 steps; on right going up and on left going up and External: 3 steps; on right going up and on left going up Has following equipment at home: Grab bars  OCCUPATION: attends day program   PLOF: Independent with basic ADLs  PATIENT GOALS: decrease pain, walking better, get ready for bocce  NEXT MD VISIT: as needed  OBJECTIVE:   DIAGNOSTIC FINDINGS:  04/26/23 XR Lumbar spine IMPRESSION:  1. No acute fracture or traumatic malalignment.  2.  Mild multilevel degenerative disease most pronounced at L4-L5 and L5-S1.  3.  Moderate facet arthropathy at L4-L5 and L5-S1   PATIENT SURVEYS:  Modified Oswestry 13/50   COGNITION: Overall cognitive status: History of cognitive impairments - at baseline     SENSATION: Not tested  MUSCLE LENGTH: Hamstrings: moderate tightness bil, R>L  Quads: moderate tightness bil   POSTURE:  thoracic scoliosis with to left and compensatory R head tilt, weight shift to left in sitting , in prone, noted R tibia shorter than L  PALPATION: Tenderness lumbar spine and R glutes  LUMBAR ROM:  very sudden movements, can touch toes, painful.   LOWER EXTREMITY ROM:       Tightness in L hip ER/IR compared to R hip.   LOWER EXTREMITY MMT:   functionally 4/5, however due to cognitive deficits difficulty following directions for MMT, can stand on toes, could not follow commands to walk on heels, 5/5 knee extension, 4/5 hip flexion.    LUMBAR SPECIAL TESTS:  Straight leg raise test: Negative  FUNCTIONAL TESTS:  5 times sit to stand: 21.9 seconds, intermittent UE assist Gait speed 0.94 m/s   GAIT: Distance walked: 100' Assistive device utilized: None Level of assistance: Complete Independence Comments: wide BOS, leans to left, turns LLE externally rotated, weight shift to left.    TODAY'S TREATMENT:                                                                                                                              DATE:   06/26/23 Therapeutic Exercise: to improve strength and mobility.  Demo, verbal and tactile cues throughout for technique.  Nustep L5 x 7 min Bridges 2 x 10  Prone knee extensions x 10 bil - still needed maxA to perform correctly Seated trunk twists and SB stretches - needed demo Manual Therapy: to decrease muscle spasm and pain and improve mobility IASTM with foam roller to lumbar erector spinae and glutes, STM/TPR to R QL  06/19/23  Therapeutic Exercise: to improve strength and mobility.  Demo, verbal and tactile cues throughout for technique.  Nustep L5 x 7 min Supine LTR to side of preference 2 x 10  Bridges 2 x 10  Prone knee bends x 10  Attempted legs extensions - discontinued due to excessive compensations and assistance needed Manual Therapy: to decrease muscle spasm and pain and improve mobility IASTM with foam roller to lumbar erector spinae and glutes, STM/TPR to R QL  06/14/23 Therapeutic Exercise: to improve strength and mobility.  Demo, verbal and tactile cues throughout for technique. Nustep L5 x 7 min Standing lateral corrections 2 x 10  Seated twists to side of preference (L) 2 x 10  Seated QL stretch R  side 3 x 30 sec hold Trialed piriformis stretches in sitting - did not like stretching L side, better tolerance on R Manual Therapy: to decrease muscle spasm and pain and improve mobility IASTM with foam roller to lumbar erector spinae and glutes, STM/TPR to R QL     PATIENT EDUCATION:  Education details: HEP review Person educated: Patient and Publishing rights manager Education method: Explanation, Demonstration, Tactile cues, and Verbal cues Education comprehension: verbalized understanding, returned demonstration, tactile cues required, and needs further education  HOME EXERCISE PROGRAM: Access Code: [email or web address] URL: [email or web address] Date: 06/14/2023 Prepared by: Harrie Foreman  Exercises - Left Standing Lateral Shift Correction at Wall - Repetitions  - 1 x daily - 7 x weekly - 2 sets - 10 reps - 3 sec hold - Supine Bridge  - 1 x daily - 7 x weekly - 2 sets - 10 reps - 3 sec hold - Seated Trunk Rotation  - 2 x daily - 7 x weekly - 1 sets - 10 reps - Seated Quadratus Lumborum Stretch in Chair  - 2 x daily - 7 x weekly - 1 sets - 10 reps  ASSESSMENT:  CLINICAL IMPRESSION: KEDAR SEDANO reported some LBP this week more central when asked to point to pain, but consistently denies pain when asked if he has pain.  Participated well but needed a lot of assistance to perform exercises, especially prone leg raises due to difficulty with coordination, better with bridges.  Still noting tenderness in L QL.  Justin Kerr continues to demonstrate potential for improvement and would benefit from continued skilled therapy to address impairments.      OBJECTIVE IMPAIRMENTS: Abnormal gait, decreased activity tolerance, decreased balance, decreased endurance, decreased knowledge of condition, difficulty walking, decreased ROM, decreased strength, hypomobility, increased fascial restrictions, increased muscle spasms, postural dysfunction, and pain.   ACTIVITY LIMITATIONS: carrying,  lifting, bending, standing, sleeping, and locomotion level  PARTICIPATION LIMITATIONS: community activity and yard work  PERSONAL FACTORS: Time since onset of injury/illness/exacerbation and 3+ comorbidities: ADHD, autism, fragile X, HTN, hypothyroidism, osteopenia, thrombocytopenia,  lumbar spondylosis  are also affecting patient's functional outcome.   REHAB POTENTIAL: Good  CLINICAL DECISION MAKING: Evolving/moderate complexity  EVALUATION COMPLEXITY: Moderate   GOALS: Goals reviewed with patient? Yes  SHORT TERM GOALS: Target date: 06/14/2023   Patient will be independent with initial HEP.  Baseline: needs Goal status: MET 06/09/23 - HEP given.  06/19/23- met   LONG TERM GOALS: Target date: 07/26/2023   Patient will be independent with advanced/ongoing HEP to improve outcomes and carryover.  Baseline:  Goal status: IN PROGRESS  2.  Patient will report 75% improvement in low back pain to improve QOL.  Baseline:  Goal status: IN PROGRESS  3.  Patient will demonstrate full pain free lumbar ROM to perform ADLs.   Baseline: pain with bending over Goal status: IN  PROGRESS  4.  Patient will demonstrate improved functional strength as demonstrated by 5x STS < 17 seconds. Baseline: 21.9 seconds, intermittant UE support Goal status: IN PROGRESS  5.  Patient will report at least 6 points improvement on modified Oswestry to demonstrate improved functional ability.  Baseline: 13/50 Goal status: IN PROGRESS   6.  Patient will tolerate 1 hour  of standing to participate in special olympics activities. Baseline: increased pain with prolonged standing Goal status: IN PROGRESS  7.   Patient will demonstrate at least 19/24 on DGI to decrease fall risk.  Baseline: NT Goal status: IN PROGRESS   PLAN:  PT FREQUENCY: 1-2x/week  PT DURATION: 8 weeks  PLANNED INTERVENTIONS: Therapeutic exercises, Therapeutic activity, Neuromuscular re-education, Balance training, Gait training,  Patient/Family education, Self Care, Joint mobilization, Stair training, Prosthetic training, Dry Needling, Electrical stimulation, Spinal manipulation, Spinal mobilization, Cryotherapy, Moist heat, Taping, Traction, Ultrasound, Manual therapy, and Re-evaluation.  PLAN FOR NEXT SESSION: DGI, assess response to heel insert, exercises for core strengthening and HEP, manual therapy.     Jena Gauss, PT, DPT 06/26/2023, 3:40 PM

## 2023-07-03 ENCOUNTER — Encounter: Payer: Self-pay | Admitting: Physical Therapy

## 2023-07-03 ENCOUNTER — Ambulatory Visit: Payer: Medicare Other | Admitting: Physical Therapy

## 2023-07-03 DIAGNOSIS — M5459 Other low back pain: Secondary | ICD-10-CM | POA: Diagnosis not present

## 2023-07-03 DIAGNOSIS — R252 Cramp and spasm: Secondary | ICD-10-CM

## 2023-07-03 DIAGNOSIS — M6281 Muscle weakness (generalized): Secondary | ICD-10-CM

## 2023-07-03 NOTE — Therapy (Signed)
OUTPATIENT PHYSICAL THERAPY TREATMENT   Patient Name: [name] MRN: [id number] DOB:03/27/1966, 57 y.o., male Today's Date: 07/03/2023  END OF SESSION:  PT End of Session - 07/03/23 1109     Visit Number 6    Date for PT Re-Evaluation 07/26/23    Authorization Type Medicare, Medicaid + T[employer] for Life    Progress Note Due on Visit 10    PT Start Time 1107    PT Stop Time 1150    PT Time Calculation (min) 43 min    Activity Tolerance Patient tolerated treatment well    Behavior During Therapy WFL for tasks assessed/performed             Past Medical History:  Diagnosis Date   ADHD    Anxiety    Autism    Fragile X syndrome    Hypertension    Hypertriglyceridemia    Hypothyroidism    acquired   Lumbar spondylosis    Osteopenia    Thrombocytopenia (HCC)    Urge incontinence of urine    Vitamin D deficiency    Past Surgical History:  Procedure Laterality Date   DENTAL RESTORATION/EXTRACTION WITH X-RAY N/A 04/21/2020   Procedure: DENTAL RESTORATION/EXTRACTION WITH X-RAY fillings in teeth 3, 4, 13, 19, 30, and 31 WITH CLEANING AND FLOURIDE;  Surgeon: Cristino Martes, DMD;  Location: [facility] OR;  Service: Dentistry;  Laterality: N/A;   DENTAL SURGERY     wisdom teeth, dental   ELBOW SURGERY Left    There are no problems to display for this patient.   PCP: Curt Bears, MD   REFERRING PROVIDER: Leola Brazil, DO   REFERRING DIAG: [phone] (ICD-10-CM) - Spondylosis without myelopathy or radiculopathy, lumbar region   Rationale for Evaluation and Treatment: Rehabilitation  THERAPY DIAG:  Other low back pain  Cramp and spasm  Muscle weakness (generalized)  ONSET DATE: several months ago.   SUBJECTIVE:                                                                                                                                                                                           SUBJECTIVE STATEMENT: Justin Kerr accompanied by caregiver  [name].  [name] reports Justin Kerr went to fair on Friday and had to practically carry him out, pain was so bad after lunch   PERTINENT HISTORY:  ADHD, autism, fragile X, HTN, hypothyroidism, osteopenia, thrombocytopenia,  lumbar spondylosis  PAIN:  Are you having pain? Yes: NPRS scale: 2 on FACE scale /10 Pain location: R buttock Pain description: ache Aggravating factors: standing for a long time Relieving factors: sitting down   PRECAUTIONS:  None  RED FLAGS: None   WEIGHT BEARING RESTRICTIONS: No  FALLS:  Has patient fallen in last 6 months? No  LIVING ENVIRONMENT: Lives with: lives with their family and lives in an adult home Lives in: House/apartment Stairs: Yes: Internal: 16 steps; on right going up and on left going up and External: 3 steps; on right going up and on left going up Has following equipment at home: Grab bars  OCCUPATION: attends day program   PLOF: Independent with basic ADLs  PATIENT GOALS: decrease pain, walking better, get ready for bocce  NEXT MD VISIT: as needed  OBJECTIVE:   DIAGNOSTIC FINDINGS:  04/26/23 XR Lumbar spine IMPRESSION:  1. No acute fracture or traumatic malalignment.  2.  Mild multilevel degenerative disease most pronounced at L4-L5 and L5-S1.  3.  Moderate facet arthropathy at L4-L5 and L5-S1   PATIENT SURVEYS:  Modified Oswestry 13/50   COGNITION: Overall cognitive status: History of cognitive impairments - at baseline     SENSATION: Not tested  MUSCLE LENGTH: Hamstrings: moderate tightness bil, R>L  Quads: moderate tightness bil   POSTURE:  thoracic scoliosis with to left and compensatory R head tilt, weight shift to left in sitting , in prone, noted R tibia shorter than L  PALPATION: Tenderness lumbar spine and R glutes  LUMBAR ROM:  very sudden movements, can touch toes, painful.   LOWER EXTREMITY ROM:      Tightness in L hip ER/IR compared to R hip.   LOWER EXTREMITY MMT:   functionally 4/5, however due to cognitive  deficits difficulty following directions for MMT, can stand on toes, could not follow commands to walk on heels, 5/5 knee extension, 4/5 hip flexion.    LUMBAR SPECIAL TESTS:  Straight leg raise test: Negative  FUNCTIONAL TESTS:  5 times sit to stand: 21.9 seconds, intermittent UE assist Gait speed 0.94 m/s   GAIT: Distance walked: 100' Assistive device utilized: None Level of assistance: Complete Independence Comments: wide BOS, leans to left, turns LLE externally rotated, weight shift to left.    TODAY'S TREATMENT:                                                                                                                              DATE:   07/03/23 Therapeutic Exercise: to improve strength and mobility.  Demo, verbal and tactile cues throughout for technique. Nustep L5 x 6 min Side bend stretches over bolster - stretching L side  Reaching with LUE while sitting on dynadisk to activate core Squats x 10 Therapeutic Activity:   DGI 18/24 Reaching with LUE to place decorations (cues to not switch hands) Manual Therapy: to decrease muscle spasm and pain and improve mobility IASTM with foam roller to lumbar erector spinae and glutes, STM/TPR to R QL   06/26/23 Therapeutic Exercise: to improve strength and mobility.  Demo, verbal and tactile cues throughout for technique.  Nustep L5 x 7 min Bridges 2 x 10  Prone knee extensions x 10 bil - still needed maxA to perform correctly Seated trunk twists and SB stretches - needed demo Manual Therapy: to decrease muscle spasm and pain and improve mobility IASTM with foam roller to lumbar erector spinae and glutes, STM/TPR to R QL  06/19/23  Therapeutic Exercise: to improve strength and mobility.  Demo, verbal and tactile cues throughout for technique. Nustep L5 x 7 min Supine LTR to side of preference 2 x 10  Bridges 2 x 10  Prone knee bends x 10  Attempted legs extensions - discontinued due to excessive compensations and  assistance needed Manual Therapy: to decrease muscle spasm and pain and improve mobility IASTM with foam roller to lumbar erector spinae and glutes, STM/TPR to R QL   PATIENT EDUCATION:  Education details: HEP review Person educated: Patient and Publishing rights manager Education method: Explanation, Demonstration, Tactile cues, and Verbal cues Education comprehension: verbalized understanding, returned demonstration, tactile cues required, and needs further education  HOME EXERCISE PROGRAM: Access Code: [email or web address] URL: [email or web address] Date: 06/14/2023 Prepared by: Harrie Foreman  Exercises - Left Standing Lateral Shift Correction at Wall - Repetitions  - 1 x daily - 7 x weekly - 2 sets - 10 reps - 3 sec hold - Supine Bridge  - 1 x daily - 7 x weekly - 2 sets - 10 reps - 3 sec hold - Seated Trunk Rotation  - 2 x daily - 7 x weekly - 1 sets - 10 reps - Seated Quadratus Lumborum Stretch in Chair  - 2 x daily - 7 x weekly - 1 sets - 10 reps  ASSESSMENT:  CLINICAL IMPRESSION: Justin Kerr participated in DGI, scoring 18/24 which is predictive of falls in community dwelling adults. He needed significantly cuing to use LUE today, but had improved posture when placed on balance disk to engage core.  Significant cues needed throughout all activities and exercises to perform correctly due to cognitive challenges.   Justin Kerr continues to demonstrate potential for improvement and would benefit from continued skilled therapy to address impairments.      OBJECTIVE IMPAIRMENTS: Abnormal gait, decreased activity tolerance, decreased balance, decreased endurance, decreased knowledge of condition, difficulty walking, decreased ROM, decreased strength, hypomobility, increased fascial restrictions, increased muscle spasms, postural dysfunction, and pain.   ACTIVITY LIMITATIONS: carrying, lifting, bending, standing, sleeping, and locomotion level  PARTICIPATION LIMITATIONS:  community activity and yard work  PERSONAL FACTORS: Time since onset of injury/illness/exacerbation and 3+ comorbidities: ADHD, autism, fragile X, HTN, hypothyroidism, osteopenia, thrombocytopenia,  lumbar spondylosis  are also affecting patient's functional outcome.   REHAB POTENTIAL: Good  CLINICAL DECISION MAKING: Evolving/moderate complexity  EVALUATION COMPLEXITY: Moderate   GOALS: Goals reviewed with patient? Yes  SHORT TERM GOALS: Target date: 06/14/2023   Patient will be independent with initial HEP.  Baseline: needs Goal status: MET 06/09/23 - HEP given.  06/19/23- met   LONG TERM GOALS: Target date: 07/26/2023   Patient will be independent with advanced/ongoing HEP to improve outcomes and carryover.  Baseline:  Goal status: IN PROGRESS  2.  Patient will report 75% improvement in low back pain to improve QOL.  Baseline:  Goal status: IN PROGRESS  3.  Patient will demonstrate full pain free lumbar ROM to perform ADLs.   Baseline: pain with bending over Goal status: IN PROGRESS  4.  Patient will demonstrate improved functional strength as demonstrated by 5x STS < 17 seconds. Baseline: 21.9 seconds, intermittant UE support Goal status: IN PROGRESS  5.  Patient will report at least 6 points improvement on modified Oswestry to demonstrate improved functional ability.  Baseline: 13/50 Goal status: IN PROGRESS   6.  Patient will tolerate 1 hour  of standing to participate in special olympics activities. Baseline: increased pain with prolonged standing Goal status: IN PROGRESS  7.   Patient will demonstrate at least 19/24 on DGI to decrease fall risk.  Baseline: 18/24 Goal status: IN PROGRESS   PLAN:  PT FREQUENCY: 1-2x/week  PT DURATION: 8 weeks  PLANNED INTERVENTIONS: Therapeutic exercises, Therapeutic activity, Neuromuscular re-education, Balance training, Gait training, Patient/Family education, Self Care, Joint mobilization, Stair training, Prosthetic  training, Dry Needling, Electrical stimulation, Spinal manipulation, Spinal mobilization, Cryotherapy, Moist heat, Taping, Traction, Ultrasound, Manual therapy, and Re-evaluation.  PLAN FOR NEXT SESSION: DGI, assess response to heel insert, exercises for core strengthening and HEP, manual therapy.     Jena Gauss, PT, DPT 07/03/2023, 5:52 PM

## 2023-07-12 ENCOUNTER — Ambulatory Visit: Payer: Medicare Other | Admitting: Physical Therapy

## 2023-07-19 ENCOUNTER — Ambulatory Visit: Payer: Medicare Other | Admitting: Physical Therapy

## 2023-07-26 ENCOUNTER — Encounter: Payer: Self-pay | Admitting: Physical Therapy

## 2023-07-26 ENCOUNTER — Ambulatory Visit: Payer: Medicare Other | Attending: Internal Medicine | Admitting: Physical Therapy

## 2023-07-26 DIAGNOSIS — M6281 Muscle weakness (generalized): Secondary | ICD-10-CM | POA: Diagnosis present

## 2023-07-26 DIAGNOSIS — M5459 Other low back pain: Secondary | ICD-10-CM | POA: Insufficient documentation

## 2023-07-26 DIAGNOSIS — R252 Cramp and spasm: Secondary | ICD-10-CM | POA: Diagnosis present

## 2023-07-26 NOTE — Patient Instructions (Signed)

## 2023-07-26 NOTE — Therapy (Signed)
OUTPATIENT PHYSICAL THERAPY TREATMENT/RECERT   Patient Name: Justin Kerr MRN: [id number] DOB:04/17/1966, 57 y.o., male Today's Date: 07/26/2023  END OF SESSION:  PT End of Session - 07/26/23 1448     Visit Number 7    Date for PT Re-Evaluation 09/06/23    Authorization Type Medicare, Medicaid + [employer] for Life    Progress Note Due on Visit 10    PT Start Time 1445    PT Stop Time 1530    PT Time Calculation (min) 45 min    Activity Tolerance Patient tolerated treatment well    Behavior During Therapy WFL for tasks assessed/performed             Past Medical History:  Diagnosis Date   ADHD    Anxiety    Autism    Fragile X syndrome    Hypertension    Hypertriglyceridemia    Hypothyroidism    acquired   Lumbar spondylosis    Osteopenia    Thrombocytopenia (HCC)    Urge incontinence of urine    Vitamin D deficiency    Past Surgical History:  Procedure Laterality Date   DENTAL RESTORATION/EXTRACTION WITH X-RAY N/A 04/21/2020   Procedure: DENTAL RESTORATION/EXTRACTION WITH X-RAY fillings in teeth 3, 4, 13, 19, 30, and 31 WITH CLEANING AND FLOURIDE;  Surgeon: Cristino Martes, DMD;  Location: [facility] OR;  Service: Dentistry;  Laterality: N/A;   DENTAL SURGERY     wisdom teeth, dental   ELBOW SURGERY Left    There are no problems to display for this patient.   PCP: Curt Bears, MD   REFERRING PROVIDER: Leola Brazil, DO   REFERRING DIAG: [phone] (ICD-10-CM) - Spondylosis without myelopathy or radiculopathy, lumbar region   Rationale for Evaluation and Treatment: Rehabilitation  THERAPY DIAG:  Other low back pain  Muscle weakness (generalized)  Cramp and spasm  ONSET DATE: several months ago.   SUBJECTIVE:                                                                                                                                                                                           SUBJECTIVE STATEMENT: Justin Kerr accompanied by  caregiver [name].  [name] reports Justin Kerr still complaining of pain.  He talked to Jonny Ruiz today about TrDN and he is willing to try.    PERTINENT HISTORY:  ADHD, autism, fragile X, HTN, hypothyroidism, osteopenia, thrombocytopenia,  lumbar spondylosis  PAIN:  Are you having pain? Yes: NPRS scale: 4 on FACE scale /10 Pain location: R buttock Pain description: ache Aggravating factors: standing for a long time Relieving factors: sitting down  PRECAUTIONS: None  RED FLAGS: None   WEIGHT BEARING RESTRICTIONS: No  FALLS:  Has patient fallen in last 6 months? No  LIVING ENVIRONMENT: Lives with: lives with their family and lives in an adult home Lives in: House/apartment Stairs: Yes: Internal: 16 steps; on right going up and on left going up and External: 3 steps; on right going up and on left going up Has following equipment at home: Grab bars  OCCUPATION: attends day program   PLOF: Independent with basic ADLs  PATIENT GOALS: decrease pain, walking better, get ready for bocce  NEXT MD VISIT: as needed  OBJECTIVE:   DIAGNOSTIC FINDINGS:  04/26/23 XR Lumbar spine IMPRESSION:  1. No acute fracture or traumatic malalignment.  2.  Mild multilevel degenerative disease most pronounced at L4-L5 and L5-S1.  3.  Moderate facet arthropathy at L4-L5 and L5-S1   PATIENT SURVEYS:  Modified Oswestry 13/50   COGNITION: Overall cognitive status: History of cognitive impairments - at baseline     SENSATION: Not tested  MUSCLE LENGTH: Hamstrings: moderate tightness bil, R>L  Quads: moderate tightness bil   POSTURE:  thoracic scoliosis with to left and compensatory R head tilt, weight shift to left in sitting , in prone, noted R tibia shorter than L  PALPATION: Tenderness lumbar spine and R glutes  LUMBAR ROM:  very sudden movements, can touch toes, painful.   LOWER EXTREMITY ROM:      Tightness in L hip ER/IR compared to R hip.   LOWER EXTREMITY MMT:   functionally 4/5, however due  to cognitive deficits difficulty following directions for MMT, can stand on toes, could not follow commands to walk on heels, 5/5 knee extension, 4/5 hip flexion.    LUMBAR SPECIAL TESTS:  Straight leg raise test: Negative  FUNCTIONAL TESTS:  5 times sit to stand: 21.9 seconds, intermittent UE assist Gait speed 0.94 m/s   GAIT: Distance walked: 100' Assistive device utilized: None Level of assistance: Complete Independence Comments: wide BOS, leans to left, turns LLE externally rotated, weight shift to left.    TODAY'S TREATMENT:                                                                                                                              DATE:   07/26/2023  Nustep L5 x 5 min Manual Therapy: to decrease muscle spasm and pain and improve mobility STM/TPR to L QL, L lumbar erector spinae, L UPA mobs lumbar spine, skilled palpation and monitoring during dry needling. Trigger Point Dry-Needling  Treatment instructions: Expect mild to moderate muscle soreness. S/S of pneumothorax if dry needled over a lung field, and to seek immediate medical attention should they occur. Patient verbalized understanding of these instructions and education. Patient Consent Given: Yes Education handout provided: Yes Muscles treated: L L4/5 lumbar multifidi, L QL Electrical stimulation performed: No Parameters: N/A Treatment response/outcome: Twitch Response Elicited and Palpable Increase in Muscle Length Ultrasound: x 8 min to  L QL 1 MHz, 1.2 w/cm2 cont to decrease inflammation/pain    07/03/23 Therapeutic Exercise: to improve strength and mobility.  Demo, verbal and tactile cues throughout for technique. Nustep L5 x 6 min Side bend stretches over bolster - stretching L side  Reaching with LUE while sitting on dynadisk to activate core Squats x 10 Therapeutic Activity:   DGI 18/24 Reaching with LUE to place decorations (cues to not switch hands) Manual Therapy: to decrease muscle  spasm and pain and improve mobility IASTM with foam roller to lumbar erector spinae and glutes, STM/TPR to R QL   06/26/23 Therapeutic Exercise: to improve strength and mobility.  Demo, verbal and tactile cues throughout for technique.  Nustep L5 x 7 min Bridges 2 x 10  Prone knee extensions x 10 bil - still needed maxA to perform correctly Seated trunk twists and SB stretches - needed demo Manual Therapy: to decrease muscle spasm and pain and improve mobility IASTM with foam roller to lumbar erector spinae and glutes, STM/TPR to R QL   PATIENT EDUCATION:  Education details: continue HEP as tolerated Person educated: Patient and Publishing rights manager Education method: Explanation Education comprehension: verbalized understanding  HOME EXERCISE PROGRAM: Access Code: [email or web address] URL: [email or web address] Date: 06/14/2023 Prepared by: Harrie Foreman  Exercises - Left Standing Lateral Shift Correction at Wall - Repetitions  - 1 x daily - 7 x weekly - 2 sets - 10 reps - 3 sec hold - Supine Bridge  - 1 x daily - 7 x weekly - 2 sets - 10 reps - 3 sec hold - Seated Trunk Rotation  - 2 x daily - 7 x weekly - 1 sets - 10 reps - Seated Quadratus Lumborum Stretch in Chair  - 2 x daily - 7 x weekly - 1 sets - 10 reps  ASSESSMENT:  CLINICAL IMPRESSION: Per caregiver, Justin Kerr continues to report low back pain, especially with prolonged standing and walking, which is limiting activities and participation with [employer].  He has missed several visits due to illness (caregivers) slowing progress.  His caregiver had TrDN today, so was able to discuss it with Jonny Ruiz today to prepare him, so Justin Kerr was willing to try and consented today.  After 2 needle insertions into multifidi, Justin Kerr did need a break and got up, but then he requested to try again, so was able to proceed with 1 more insertion into QL in s/l.  Justin Kerr tolerated well.  Followed this with [place] to L QL, after interventions, Justin Kerr reported  he was "a new man" and was able to stand more erect and move back in all directions without pain.  Since he has only had limited visits and is just now starting to see benefit, recommending continued skilled therapy for additional 1-2x/week for 6 more weeks.   Justin Kerr continues to demonstrate potential for improvement and would benefit from continued skilled therapy to address impairments.      OBJECTIVE IMPAIRMENTS: Abnormal gait, decreased activity tolerance, decreased balance, decreased endurance, decreased knowledge of condition, difficulty walking, decreased ROM, decreased strength, hypomobility, increased fascial restrictions, increased muscle spasms, postural dysfunction, and pain.   ACTIVITY LIMITATIONS: carrying, lifting, bending, standing, sleeping, and locomotion level  PARTICIPATION LIMITATIONS: community activity and yard work  PERSONAL FACTORS: Time since onset of injury/illness/exacerbation and 3+ comorbidities: ADHD, autism, fragile X, HTN, hypothyroidism, osteopenia, thrombocytopenia,  lumbar spondylosis  are also affecting patient's functional outcome.   REHAB POTENTIAL: Good  CLINICAL DECISION MAKING: Evolving/moderate complexity  EVALUATION COMPLEXITY: Moderate  GOALS: Goals reviewed with patient? Yes  SHORT TERM GOALS: Target date: 06/14/2023   Patient will be independent with initial HEP.  Baseline: needs Goal status: MET 06/09/23 - HEP given.  06/19/23- met   LONG TERM GOALS: Target date: 07/26/2023  extended to 09/06/23  Patient will be independent with advanced/ongoing HEP to improve outcomes and carryover.  Baseline:  Goal status: IN PROGRESS 07/26/23 - met for current.   2.  Patient will report 75% improvement in low back pain to improve QOL.  Baseline:  Goal status: IN PROGRESS 07/26/23- maybe a little improvement per caregiver  3.  Patient will demonstrate full pain free lumbar ROM to perform ADLs.   Baseline: pain with bending over Goal status:  IN PROGRESS 07/26/23- no pain today after MT  4.  Patient will demonstrate improved functional strength as demonstrated by 5x STS < 17 seconds. Baseline: 21.9 seconds, intermittant UE support Goal status: IN PROGRESS  5.  Patient will report at least 6 points improvement on modified Oswestry to demonstrate improved functional ability.  Baseline: 13/50 Goal status: IN PROGRESS   6.  Patient will tolerate 1 hour  of standing to participate in special olympics activities. Baseline: increased pain with prolonged standing Goal status: IN PROGRESS  07/26/23- still has pain with standing.   7.   Patient will demonstrate at least 19/24 on DGI to decrease fall risk.  Baseline: NT Goal status: IN PROGRESS 07/03/23 18/24   PLAN:  PT FREQUENCY: 1-2x/week  PT DURATION: 6 weeks  PLANNED INTERVENTIONS: Therapeutic exercises, Therapeutic activity, Neuromuscular re-education, Balance training, Gait training, Patient/Family education, Self Care, Joint mobilization, Stair training, Prosthetic training, Dry Needling, Electrical stimulation, Spinal manipulation, Spinal mobilization, Cryotherapy, Moist heat, Taping, Traction, Ultrasound, Manual therapy, and Re-evaluation.  PLAN FOR NEXT SESSION: continue to progress core strengthening,  manual therapy, assess response to TrDN   Jena Gauss, PT, DPT 07/26/2023, 3:46 PM

## 2023-08-09 ENCOUNTER — Encounter: Payer: Self-pay | Admitting: Physical Therapy

## 2023-08-09 ENCOUNTER — Ambulatory Visit: Payer: Medicare Other | Admitting: Physical Therapy

## 2023-08-09 DIAGNOSIS — M5459 Other low back pain: Secondary | ICD-10-CM

## 2023-08-09 DIAGNOSIS — R252 Cramp and spasm: Secondary | ICD-10-CM

## 2023-08-09 DIAGNOSIS — M6281 Muscle weakness (generalized): Secondary | ICD-10-CM

## 2023-08-09 NOTE — Therapy (Unsigned)
OUTPATIENT PHYSICAL THERAPY TREATMENT   Patient Name: Justin Kerr MRN: [id number] DOB:03-Oct-1965, 57 y.o., male Today's Date: 08/09/2023  END OF SESSION:  PT End of Session - 08/09/23 1452     Visit Number 8    Date for PT Re-Evaluation 09/06/23    Authorization Type Medicare, Medicaid + Tricare for Life    Progress Note Due on Visit 10    PT Start Time 1447    PT Stop Time 1530    PT Time Calculation (min) 43 min    Activity Tolerance Patient tolerated treatment well    Behavior During Therapy WFL for tasks assessed/performed             Past Medical History:  Diagnosis Date   ADHD    Anxiety    Autism    Fragile X syndrome    Hypertension    Hypertriglyceridemia    Hypothyroidism    acquired   Lumbar spondylosis    Osteopenia    Thrombocytopenia (HCC)    Urge incontinence of urine    Vitamin D deficiency    Past Surgical History:  Procedure Laterality Date   DENTAL RESTORATION/EXTRACTION WITH X-RAY N/A 04/21/2020   Procedure: DENTAL RESTORATION/EXTRACTION WITH X-RAY fillings in teeth 3, 4, 13, 19, 30, and 31 WITH CLEANING AND FLOURIDE;  Surgeon: Cristino Martes, DMD;  Location: [facility] OR;  Service: Dentistry;  Laterality: N/A;   DENTAL SURGERY     wisdom teeth, dental   ELBOW SURGERY Left    There are no problems to display for this patient.   PCP: Curt Bears, MD   REFERRING PROVIDER: Leola Brazil, DO   REFERRING DIAG: [phone] (ICD-10-CM) - Spondylosis without myelopathy or radiculopathy, lumbar region   Rationale for Evaluation and Treatment: Rehabilitation  THERAPY DIAG:  Other low back pain  Muscle weakness (generalized)  Cramp and spasm  ONSET DATE: several months ago.   SUBJECTIVE:                                                                                                                                                                                           SUBJECTIVE STATEMENT: Justin Kerr accompanied by caregiver  [name].  [name] reports that Justin Kerr has not been complaining about back pain as much since dry needling.  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     PERTINENT HISTORY:  ADHD, autism, fragile X, HTN, hypothyroidism, osteopenia, thrombocytopenia,  lumbar spondylosis  PAIN:  Are you having pain? Yes: NPRS scale: 2 on FACES scale /10 Pain location: R buttock Pain description: ache Aggravating factors: standing for a long time Relieving factors:  sitting down   PRECAUTIONS: None  RED FLAGS: None   WEIGHT BEARING RESTRICTIONS: No  FALLS:  Has patient fallen in last 6 months? No  LIVING ENVIRONMENT: Lives with: lives with their family and lives in an adult home Lives in: House/apartment Stairs: Yes: Internal: 16 steps; on right going up and on left going up and External: 3 steps; on right going up and on left going up Has following equipment at home: Grab bars  OCCUPATION: attends day program   PLOF: Independent with basic ADLs  PATIENT GOALS: decrease pain, walking better, get ready for bocce  NEXT MD VISIT: as needed  OBJECTIVE:   DIAGNOSTIC FINDINGS:  04/26/23 XR Lumbar spine IMPRESSION:  1. No acute fracture or traumatic malalignment.  2.  Mild multilevel degenerative disease most pronounced at L4-L5 and L5-S1.  3.  Moderate facet arthropathy at L4-L5 and L5-S1   PATIENT SURVEYS:  Modified Oswestry 13/50   COGNITION: Overall cognitive status: History of cognitive impairments - at baseline     SENSATION: Not tested  MUSCLE LENGTH: Hamstrings: moderate tightness bil, R>L  Quads: moderate tightness bil   POSTURE:  thoracic scoliosis with to left and compensatory R head tilt, weight shift to left in sitting , in prone, noted R tibia shorter than L  PALPATION: Tenderness lumbar spine and R glutes  LUMBAR ROM:  very sudden movements, can touch toes, painful.   LOWER EXTREMITY ROM:      Tightness in L hip ER/IR compared to R hip.   LOWER EXTREMITY MMT:    functionally 4/5, however due to cognitive deficits difficulty following directions for MMT, can stand on toes, could not follow commands to walk on heels, 5/5 knee extension, 4/5 hip flexion.    LUMBAR SPECIAL TESTS:  Straight leg raise test: Negative  FUNCTIONAL TESTS:  5 times sit to stand: 21.9 seconds, intermittent UE assist Gait speed 0.94 m/s   GAIT: Distance walked: 100' Assistive device utilized: None Level of assistance: Complete Independence Comments: wide BOS, leans to left, turns LLE externally rotated, weight shift to left.    TODAY'S TREATMENT:                                                                                                                              DATE:   08/09/23  NuStep L6 x 6 min  Prone leg extensions - max assist needed today Bridges 2 x 10 Manual Therapy: to decrease muscle spasm and pain and improve mobility Percussive devise to  L QL, L lumbar erector spinae,  and R glutes, L UPA mobs lumbar spine,  skilled palpation and monitoring during dry needling. Trigger Point Dry-Needling  Treatment instructions: Expect mild to moderate muscle soreness. S/S of pneumothorax if dry needled over a lung field, and to seek immediate medical attention should they occur. Patient verbalized understanding of these instructions and education. Patient Consent Given: Yes Education handout provided: Yes Muscles treated: L L3-5 lumbar multifidi,  R glutes Electrical stimulation performed: No Parameters: N/A Treatment response/outcome: Twitch Response Elicited and Palpable Increase in Muscle Length  07/26/2023  Nustep L5 x 5 min Manual Therapy: to decrease muscle spasm and pain and improve mobility STM/TPR to L QL, L lumbar erector spinae, L UPA mobs lumbar spine, skilled palpation and monitoring during dry needling. Trigger Point Dry-Needling  Treatment instructions: Expect mild to moderate muscle soreness. S/S of pneumothorax if dry needled over a lung field,  and to seek immediate medical attention should they occur. Patient verbalized understanding of these instructions and education. Patient Consent Given: Yes Education handout provided: Yes Muscles treated: L L4/5 lumbar multifidi, L QL Electrical stimulation performed: No Parameters: N/A Treatment response/outcome: Twitch Response Elicited and Palpable Increase in Muscle Length Ultrasound: x 8 min to L QL 1 MHz, 1.2 w/cm2 cont to decrease inflammation/pain    07/03/23 Therapeutic Exercise: to improve strength and mobility.  Demo, verbal and tactile cues throughout for technique. Nustep L5 x 6 min Side bend stretches over bolster - stretching L side  Reaching with LUE while sitting on dynadisk to activate core Squats x 10 Therapeutic Activity:   DGI 18/24 Reaching with LUE to place decorations (cues to not switch hands) Manual Therapy: to decrease muscle spasm and pain and improve mobility IASTM with foam roller to lumbar erector spinae and glutes, STM/TPR to R QL   PATIENT EDUCATION:  Education details: continue HEP as tolerated Person educated: Patient and Publishing rights manager Education method: Explanation Education comprehension: verbalized understanding  HOME EXERCISE PROGRAM: Access Code: [email or web address] URL: [email or web address] Date: 06/14/2023 Prepared by: Harrie Foreman  Exercises - Left Standing Lateral Shift Correction at Wall - Repetitions  - 1 x daily - 7 x weekly - 2 sets - 10 reps - 3 sec hold - Supine Bridge  - 1 x daily - 7 x weekly - 2 sets - 10 reps - 3 sec hold - Seated Trunk Rotation  - 2 x daily - 7 x weekly - 1 sets - 10 reps - Seated Quadratus Lumborum Stretch in Chair  - 2 x daily - 7 x weekly - 1 sets - 10 reps  ASSESSMENT:  CLINICAL IMPRESSION: ANIRUDH GIVINS reports decreased pain after last session.  Still having difficulty sequencing exercises.  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  Good tolerance to manual therapy, reported no pain after interventions.  Justin Kerr continues to demonstrate potential for improvement and would benefit from continued skilled therapy to address impairments.      OBJECTIVE IMPAIRMENTS: Abnormal gait, decreased activity tolerance, decreased balance, decreased endurance, decreased knowledge of condition, difficulty walking, decreased ROM, decreased strength, hypomobility, increased fascial restrictions, increased muscle spasms, postural dysfunction, and pain.   ACTIVITY LIMITATIONS: carrying, lifting, bending, standing, sleeping, and locomotion level  PARTICIPATION LIMITATIONS: community activity and yard work  PERSONAL FACTORS: Time since onset of injury/illness/exacerbation and 3+ comorbidities: ADHD, autism, fragile X, HTN, hypothyroidism, osteopenia, thrombocytopenia,  lumbar spondylosis  are also affecting patient's functional outcome.   REHAB POTENTIAL: Good  CLINICAL DECISION MAKING: Evolving/moderate complexity  EVALUATION COMPLEXITY: Moderate   GOALS: Goals reviewed with patient? Yes  SHORT TERM GOALS: Target date: 06/14/2023   Patient will be independent with initial HEP.  Baseline: needs Goal status: MET 06/09/23 - HEP given.  06/19/23-  met   LONG TERM GOALS: Target date: 07/26/2023  extended to 09/06/23  Patient will be independent with advanced/ongoing HEP to improve outcomes and carryover.  Baseline:  Goal status: IN PROGRESS 07/26/23 - met for current.   2.  Patient will report 75% improvement in low back pain to improve QOL.  Baseline:  Goal status: IN PROGRESS 07/26/23- maybe a little improvement per caregiver  3.  Patient will demonstrate full pain free lumbar ROM to perform ADLs.   Baseline: pain with bending over Goal status: IN PROGRESS 07/26/23-  no pain today after MT  4.  Patient will demonstrate improved functional strength as demonstrated by 5x STS < 17 seconds. Baseline: 21.9 seconds, intermittant UE support Goal status: IN PROGRESS  5.  Patient will report at least 6 points improvement on modified Oswestry to demonstrate improved functional ability.  Baseline: 13/50 Goal status: IN PROGRESS   6.  Patient will tolerate 1 hour  of standing to participate in special olympics activities. Baseline: increased pain with prolonged standing Goal status: IN PROGRESS  07/26/23- still has pain with standing.   7.   Patient will demonstrate at least 19/24 on DGI to decrease fall risk.  Baseline: NT Goal status: IN PROGRESS 07/03/23 18/24   PLAN:  PT FREQUENCY: 1-2x/week  PT DURATION: 6 weeks  PLANNED INTERVENTIONS: Therapeutic exercises, Therapeutic activity, Neuromuscular re-education, Balance training, Gait training, Patient/Family education, Self Care, Joint mobilization, Stair training, Prosthetic training, Dry Needling, Electrical stimulation, Spinal manipulation, Spinal mobilization, Cryotherapy, Moist heat, Taping, Traction, Ultrasound, Manual therapy, and Re-evaluation.  PLAN FOR NEXT SESSION: continue to progress core strengthening,  manual therapy, assess response to TrDN   Jena Gauss, PT, DPT 08/09/2023, 5:32 PM

## 2023-08-23 ENCOUNTER — Ambulatory Visit: Payer: Medicare Other | Attending: Internal Medicine | Admitting: Physical Therapy

## 2023-08-23 ENCOUNTER — Encounter: Payer: Self-pay | Admitting: Physical Therapy

## 2023-08-23 DIAGNOSIS — M6281 Muscle weakness (generalized): Secondary | ICD-10-CM | POA: Diagnosis present

## 2023-08-23 DIAGNOSIS — R252 Cramp and spasm: Secondary | ICD-10-CM | POA: Diagnosis present

## 2023-08-23 DIAGNOSIS — M5459 Other low back pain: Secondary | ICD-10-CM | POA: Insufficient documentation

## 2023-08-23 NOTE — Therapy (Signed)
OUTPATIENT PHYSICAL THERAPY TREATMENT   Patient Name: Justin Kerr MRN: [id number] DOB:18-Aug-1966, 57 y.o., male Today's Date: 08/23/2023  END OF SESSION:  PT End of Session - 08/23/23 0935     Visit Number 9    Date for PT Re-Evaluation 09/06/23    Authorization Type Medicare, Medicaid + [employer] for [employer]    Progress Note Due on Visit 10    PT Start Time 0934    PT Stop Time 1018    PT Time Calculation (min) 44 min    Activity Tolerance Patient tolerated treatment well    Behavior During Therapy WFL for tasks assessed/performed             Past Medical History:  Diagnosis Date   ADHD    Anxiety    Autism    Fragile X syndrome    Hypertension    Hypertriglyceridemia    Hypothyroidism    acquired   Lumbar spondylosis    Osteopenia    Thrombocytopenia (HCC)    Urge incontinence of urine    Vitamin D deficiency    Past Surgical History:  Procedure Laterality Date   DENTAL RESTORATION/EXTRACTION WITH X-RAY N/A 04/21/2020   Procedure: DENTAL RESTORATION/EXTRACTION WITH X-RAY fillings in teeth 3, 4, 13, 19, 30, and 31 WITH CLEANING AND FLOURIDE;  Surgeon: Cristino Martes, DMD;  Location: [facility] OR;  Service: Dentistry;  Laterality: N/A;   DENTAL SURGERY     wisdom teeth, dental   ELBOW SURGERY Left    There are no problems to display for this patient.   PCP: Curt Bears, MD   REFERRING PROVIDER: Curt Bears, MD   REFERRING DIAG: [phone] (ICD-10-CM) - Spondylosis without myelopathy or radiculopathy, lumbar region   Rationale for Evaluation and Treatment: Rehabilitation  THERAPY DIAG:  Other low back pain  Muscle weakness (generalized)  Cramp and spasm  ONSET DATE: several months ago.   SUBJECTIVE:                                                                                                                                                                                           SUBJECTIVE STATEMENT: Justin Kerr accompanied by caregiver [name].   [name] reports he had a lot of pain on Sunday at [employer], had to switch to truck just after starting to walk.      PERTINENT HISTORY:  ADHD, autism, fragile X, HTN, hypothyroidism, osteopenia, thrombocytopenia,  lumbar spondylosis  PAIN:  Are you having pain? Yes: NPRS scale: 2 on FACES scale /10 Pain location: low back Pain description: ache Aggravating factors: standing for a long time Relieving factors: sitting down  PRECAUTIONS: None  RED FLAGS: None   WEIGHT BEARING RESTRICTIONS: No  FALLS:  Has patient fallen in last 6 months? No  LIVING ENVIRONMENT: Lives with: lives with their family and lives in an adult home Lives in: House/apartment Stairs: Yes: Internal: 16 steps; on right going up and on left going up and External: 3 steps; on right going up and on left going up Has following equipment at home: Grab bars  OCCUPATION: attends day program   PLOF: Independent with basic ADLs  PATIENT GOALS: decrease pain, walking better, get ready for bocce  NEXT MD VISIT: as needed  OBJECTIVE:   DIAGNOSTIC FINDINGS:  04/26/23 XR Lumbar spine IMPRESSION:  1. No acute fracture or traumatic malalignment.  2.  Mild multilevel degenerative disease most pronounced at L4-L5 and L5-S1.  3.  Moderate facet arthropathy at L4-L5 and L5-S1   PATIENT SURVEYS:  Modified Oswestry 13/50   COGNITION: Overall cognitive status: History of cognitive impairments - at baseline     SENSATION: Not tested  MUSCLE LENGTH: Hamstrings: moderate tightness bil, R>L  Quads: moderate tightness bil   POSTURE:  thoracic scoliosis with to left and compensatory R head tilt, weight shift to left in sitting , in prone, noted R tibia shorter than L  PALPATION: Tenderness lumbar spine and R glutes  LUMBAR ROM:  very sudden movements, can touch toes, painful.   LOWER EXTREMITY ROM:      Tightness in L hip ER/IR compared to R hip.   LOWER EXTREMITY MMT:   functionally 4/5, however due to cognitive  deficits difficulty following directions for MMT, can stand on toes, could not follow commands to walk on heels, 5/5 knee extension, 4/5 hip flexion.    LUMBAR SPECIAL TESTS:  Straight leg raise test: Negative  FUNCTIONAL TESTS:  5 times sit to stand: 21.9 seconds, intermittent UE assist Gait speed 0.94 m/s   GAIT: Distance walked: 100' Assistive device utilized: None Level of assistance: Complete Independence Comments: wide BOS, leans to left, turns LLE externally rotated, weight shift to left.    TODAY'S TREATMENT:                                                                                                                              DATE:   08/23/23 Therapeutic Exercise: to improve strength and mobility.  Demo, verbal and tactile cues throughout for technique. Gait x 6 min - 900' - lean to left, slowed towards end, rated pain 2/10 on FACES Nustep L6 x 5 min  In prone - Prone knee bends x 10 each side - needed tactile cues initially then verbal cues Prone press-ups x 10 - needed max tactile and verbal cues for sequencing Manual Therapy: to decrease muscle spasm and pain and improve mobility Percussive devise to  bil QL,  bil lumbar erector spinae,  STM/TPR to bil glutes, L UPA mobs lumbar spine,  skilled palpation and monitoring during dry needling. Trigger Point Dry-Needling  Treatment instructions: Expect mild to moderate muscle soreness. S/S of pneumothorax if dry needled over a lung field, and to seek immediate medical attention should they occur. Patient verbalized understanding of these instructions and education. Patient Consent Given: Yes Education handout provided: Yes Muscles treated: bil L3-5 lumbar multifidi Electrical stimulation performed: No Parameters: N/A Treatment response/outcome: Twitch Response Elicited and Palpable Increase in Muscle Length   08/09/23  NuStep L6 x 6 min  Prone leg extensions - max assist needed today Bridges 2 x 10 Manual Therapy:  to decrease muscle spasm and pain and improve mobility Percussive devise to  L QL, L lumbar erector spinae,  and R glutes, L UPA mobs lumbar spine,  skilled palpation and monitoring during dry needling. Trigger Point Dry-Needling  Treatment instructions: Expect mild to moderate muscle soreness. S/S of pneumothorax if dry needled over a lung field, and to seek immediate medical attention should they occur. Patient verbalized understanding of these instructions and education. Patient Consent Given: Yes Education handout provided: Yes Muscles treated: L L3-5 lumbar multifidi, R glutes Electrical stimulation performed: No Parameters: N/A Treatment response/outcome: Twitch Response Elicited and Palpable Increase in Muscle Length  07/26/2023  Nustep L5 x 5 min Manual Therapy: to decrease muscle spasm and pain and improve mobility STM/TPR to L QL, L lumbar erector spinae, L UPA mobs lumbar spine, skilled palpation and monitoring during dry needling. Trigger Point Dry-Needling  Treatment instructions: Expect mild to moderate muscle soreness. S/S of pneumothorax if dry needled over a lung field, and to seek immediate medical attention should they occur. Patient verbalized understanding of these instructions and education. Patient Consent Given: Yes Education handout provided: Yes Muscles treated: L L4/5 lumbar multifidi, L QL Electrical stimulation performed: No Parameters: N/A Treatment response/outcome: Twitch Response Elicited and Palpable Increase in Muscle Length Ultrasound: x 8 min to L QL 1 MHz, 1.2 w/cm2 cont to decrease inflammation/pain  PATIENT EDUCATION:  Education details: continue HEP as tolerated Person educated: Patient and Publishing rights manager Education method: Explanation Education comprehension: verbalized understanding  HOME EXERCISE PROGRAM: Access Code: [email or web address] URL: [email or web address] Date: 06/14/2023 Prepared by: Harrie Foreman  Exercises - Left  Standing Lateral Shift Correction at Wall - Repetitions  - 1 x daily - 7 x weekly - 2 sets - 10 reps - 3 sec hold - Supine Bridge  - 1 x daily - 7 x weekly - 2 sets - 10 reps - 3 sec hold - Seated Trunk Rotation  - 2 x daily - 7 x weekly - 1 sets - 10 reps - Seated Quadratus Lumborum Stretch in Chair  - 2 x daily - 7 x weekly - 1 sets - 10 reps  ASSESSMENT:  CLINICAL IMPRESSION: Justin Kerr had difficulty this week with prolonged walking, but no complaint today with walking on level surface, although definitely starting to fatigue and slow after 6 min (900 ft).  Still extremely challenged with exercises requiring max cueing to perform correctly, with prone press-ups kept attempting to lift hips instead, requiring therapist to stabilize at hips and cue at shoulders.   He is now asking for TrDN and reports decreases pain.   Justin Kerr continues to demonstrate potential for improvement and would benefit from continued skilled therapy to address impairments.      OBJECTIVE IMPAIRMENTS: Abnormal gait, decreased activity tolerance, decreased balance, decreased endurance, decreased knowledge of condition, difficulty walking, decreased ROM, decreased strength, hypomobility, increased fascial restrictions, increased muscle spasms, postural dysfunction, and pain.   ACTIVITY LIMITATIONS: carrying,  lifting, bending, standing, sleeping, and locomotion level  PARTICIPATION LIMITATIONS: community activity and yard work  PERSONAL FACTORS: Time since onset of injury/illness/exacerbation and 3+ comorbidities: ADHD, autism, fragile X, HTN, hypothyroidism, osteopenia, thrombocytopenia,  lumbar spondylosis  are also affecting patient's functional outcome.   REHAB POTENTIAL: Good  CLINICAL DECISION MAKING: Evolving/moderate complexity  EVALUATION COMPLEXITY: Moderate   GOALS: Goals reviewed with patient? Yes  SHORT TERM GOALS: Target date: 06/14/2023   Patient will be independent with initial HEP.   Baseline: needs Goal status: MET 06/09/23 - HEP given.  06/19/23- met   LONG TERM GOALS: Target date: 07/26/2023  extended to 09/06/23  Patient will be independent with advanced/ongoing HEP to improve outcomes and carryover.  Baseline:  Goal status: IN PROGRESS 07/26/23 - met for current.   2.  Patient will report 75% improvement in low back pain to improve QOL.  Baseline:  Goal status: IN PROGRESS 07/26/23- maybe a little improvement per caregiver  3.  Patient will demonstrate full pain free lumbar ROM to perform ADLs.   Baseline: pain with bending over Goal status: IN PROGRESS 07/26/23- no pain today after MT  4.  Patient will demonstrate improved functional strength as demonstrated by 5x STS < 17 seconds. Baseline: 21.9 seconds, intermittant UE support Goal status: IN PROGRESS  5.  Patient will report at least 6 points improvement on modified Oswestry to demonstrate improved functional ability.  Baseline: 13/50 Goal status: IN PROGRESS   6.  Patient will tolerate 1 hour  of standing to participate in special olympics activities. Baseline: increased pain with prolonged standing Goal status: IN PROGRESS  07/26/23- still has pain with standing.   7.   Patient will demonstrate at least 19/24 on DGI to decrease fall risk.  Baseline: NT Goal status: IN PROGRESS 07/03/23 18/24   PLAN:  PT FREQUENCY: 1-2x/week  PT DURATION: 6 weeks  PLANNED INTERVENTIONS: Therapeutic exercises, Therapeutic activity, Neuromuscular re-education, Balance training, Gait training, Patient/Family education, Self Care, Joint mobilization, Stair training, Prosthetic training, Dry Needling, Electrical stimulation, Spinal manipulation, Spinal mobilization, Cryotherapy, Moist heat, Taping, Traction, Ultrasound, Manual therapy, and Re-evaluation.  PLAN FOR NEXT SESSION: continue to progress core strengthening,  manual therapy, assess response to TrDN   Jena Gauss, PT, DPT 08/23/2023, 1:53 PM

## 2023-08-30 ENCOUNTER — Ambulatory Visit: Payer: Medicare Other | Admitting: Physical Therapy

## 2023-08-30 ENCOUNTER — Encounter: Payer: Self-pay | Admitting: Physical Therapy

## 2023-08-30 DIAGNOSIS — M5459 Other low back pain: Secondary | ICD-10-CM | POA: Diagnosis not present

## 2023-08-30 DIAGNOSIS — M6281 Muscle weakness (generalized): Secondary | ICD-10-CM

## 2023-08-30 DIAGNOSIS — R252 Cramp and spasm: Secondary | ICD-10-CM

## 2023-08-30 NOTE — Therapy (Signed)
OUTPATIENT PHYSICAL THERAPY TREATMENT Progress Note Reporting Period 05/31/2023 to 08/30/2023  See note below for Objective Data and Assessment of Progress/Goals.      Patient Name: Justin Kerr MRN: [id number] DOB:03/15/1966, 57 y.o., male Today's Date: 08/30/2023  END OF SESSION:  PT End of Session - 08/30/23 0948     Visit Number 10    Date for PT Re-Evaluation 09/06/23    Authorization Type Medicare, Medicaid + Tricare for Life    Progress Note Due on Visit 10    PT Start Time 0945    PT Stop Time 1018    PT Time Calculation (min) 33 min    Activity Tolerance Patient tolerated treatment well    Behavior During Therapy WFL for tasks assessed/performed             Past Medical History:  Diagnosis Date   ADHD    Anxiety    Autism    Fragile X syndrome    Hypertension    Hypertriglyceridemia    Hypothyroidism    acquired   Lumbar spondylosis    Osteopenia    Thrombocytopenia (HCC)    Urge incontinence of urine    Vitamin D deficiency    Past Surgical History:  Procedure Laterality Date   DENTAL RESTORATION/EXTRACTION WITH X-RAY N/A 04/21/2020   Procedure: DENTAL RESTORATION/EXTRACTION WITH X-RAY fillings in teeth 3, 4, 13, 19, 30, and 31 WITH CLEANING AND FLOURIDE;  Surgeon: Cristino Martes, DMD;  Location: [facility] OR;  Service: Dentistry;  Laterality: N/A;   DENTAL SURGERY     wisdom teeth, dental   ELBOW SURGERY Left    There are no problems to display for this patient.   PCP: Curt Bears, MD   REFERRING PROVIDER: Leola Brazil, DO   REFERRING DIAG: [phone] (ICD-10-CM) - Spondylosis without myelopathy or radiculopathy, lumbar region   Rationale for Evaluation and Treatment: Rehabilitation  THERAPY DIAG:  Other low back pain  Muscle weakness (generalized)  Cramp and spasm  ONSET DATE: several months ago.   SUBJECTIVE:                                                                                                                                                                                            SUBJECTIVE STATEMENT: [name] accompanied by caregiver [name].  Arrived late today.  Darrell reports more pain today.   Caregiver reports he hasn't been complaining of pain and has been walking straighter, however he has been acting out at day program more and not sure why.   PERTINENT HISTORY:  ADHD, autism, fragile X, HTN, hypothyroidism, osteopenia, thrombocytopenia,  lumbar spondylosis  PAIN:  Are you having pain? Yes: NPRS scale: 4 on FACES scale /10 Pain location: low back Pain description: ache Aggravating factors: standing for a long time Relieving factors: sitting down   PRECAUTIONS: None  RED FLAGS: None   WEIGHT BEARING RESTRICTIONS: No  FALLS:  Has patient fallen in last 6 months? No  LIVING ENVIRONMENT: Lives with: lives with their family and lives in an adult home Lives in: House/apartment Stairs: Yes: Internal: 16 steps; on right going up and on left going up and External: 3 steps; on right going up and on left going up Has following equipment at home: Grab bars  OCCUPATION: attends day program   PLOF: Independent with basic ADLs  PATIENT GOALS: decrease pain, walking better, get ready for bocce  NEXT MD VISIT: as needed  OBJECTIVE:   DIAGNOSTIC FINDINGS:  04/26/23 XR Lumbar spine IMPRESSION:  1. No acute fracture or traumatic malalignment.  2.  Mild multilevel degenerative disease most pronounced at L4-L5 and L5-S1.  3.  Moderate facet arthropathy at L4-L5 and L5-S1   PATIENT SURVEYS:  Modified Oswestry 13/50   COGNITION: Overall cognitive status: History of cognitive impairments - at baseline     SENSATION: Not tested  MUSCLE LENGTH: Hamstrings: moderate tightness bil, R>L  Quads: moderate tightness bil   POSTURE:  thoracic scoliosis with to left and compensatory R head tilt, weight shift to left in sitting , in prone, noted R tibia shorter than L  PALPATION: Tenderness  lumbar spine and R glutes  LUMBAR ROM:  very sudden movements, can touch toes, painful.   LOWER EXTREMITY ROM:      Tightness in L hip ER/IR compared to R hip.   LOWER EXTREMITY MMT:   functionally 4/5, however due to cognitive deficits difficulty following directions for MMT, can stand on toes, could not follow commands to walk on heels, 5/5 knee extension, 4/5 hip flexion.    LUMBAR SPECIAL TESTS:  Straight leg raise test: Negative  FUNCTIONAL TESTS:  5 times sit to stand: 21.9 seconds, intermittent UE assist Gait speed 0.94 m/s   GAIT: Distance walked: 100' Assistive device utilized: None Level of assistance: Complete Independence Comments: wide BOS, leans to left, turns LLE externally rotated, weight shift to left.    TODAY'S TREATMENT:                                                                                                                              DATE:   08/25/23 Therapeutic Activity:   Nustep L5 x 6 min for warm-up/muscle perfusion Stairs - descending reciprocal step 1 HR, ascending reciprocal step, 2 HR, rapid pace Lumbar AROM - no pain Manual Therapy: to decrease muscle spasm and pain and improve mobility  STM/TPR to bil glutes, R UPA mobs lumbar spine,  skilled palpation and monitoring during dry needling. Trigger Point Dry-Needling  Treatment instructions: Expect mild to moderate muscle soreness. S/S of pneumothorax if dry  needled over a lung field, and to seek immediate medical attention should they occur. Patient verbalized understanding of these instructions and education. Patient Consent Given: Yes Education handout provided: Yes Muscles treated: bil L3-5 lumbar multifidi Electrical stimulation performed: No Parameters: N/A Treatment response/outcome: Twitch Response Elicited and Palpable Increase in Muscle Length  08/23/23 Therapeutic Exercise: to improve strength and mobility.  Demo, verbal and tactile cues throughout for technique. Gait x 6 min -  900' - lean to left, slowed towards end, rated pain 2/10 on FACES Nustep L6 x 5 min  In prone - Prone knee bends x 10 each side - needed tactile cues initially then verbal cues Prone press-ups x 10 - needed max tactile and verbal cues for sequencing Manual Therapy: to decrease muscle spasm and pain and improve mobility Percussive devise to  bil QL,  bil lumbar erector spinae,  STM/TPR to bil glutes, L UPA mobs lumbar spine,  skilled palpation and monitoring during dry needling. Trigger Point Dry-Needling  Treatment instructions: Expect mild to moderate muscle soreness. S/S of pneumothorax if dry needled over a lung field, and to seek immediate medical attention should they occur. Patient verbalized understanding of these instructions and education. Patient Consent Given: Yes Education handout provided: Yes Muscles treated: bil L3-5 lumbar multifidi Electrical stimulation performed: No Parameters: N/A Treatment response/outcome: Twitch Response Elicited and Palpable Increase in Muscle Length   08/09/23  NuStep L6 x 6 min  Prone leg extensions - max assist needed today Bridges 2 x 10 Manual Therapy: to decrease muscle spasm and pain and improve mobility Percussive devise to  L QL, L lumbar erector spinae,  and R glutes, L UPA mobs lumbar spine,  skilled palpation and monitoring during dry needling. Trigger Point Dry-Needling  Treatment instructions: Expect mild to moderate muscle soreness. S/S of pneumothorax if dry needled over a lung field, and to seek immediate medical attention should they occur. Patient verbalized understanding of these instructions and education. Patient Consent Given: Yes Education handout provided: Yes Muscles treated: L L3-5 lumbar multifidi, R glutes Electrical stimulation performed: No Parameters: N/A Treatment response/outcome: Twitch Response Elicited and Palpable Increase   PATIENT EDUCATION:  Education details: continue HEP as tolerated Person educated:  Patient and Publishing rights manager Education method: Explanation Education comprehension: verbalized understanding  HOME EXERCISE PROGRAM: Access Code: [email or web address] URL: [email or web address] Date: 06/14/2023 Prepared by: Harrie Foreman  Exercises - Left Standing Lateral Shift Correction at Wall - Repetitions  - 1 x daily - 7 x weekly - 2 sets - 10 reps - 3 sec hold - Supine Bridge  - 1 x daily - 7 x weekly - 2 sets - 10 reps - 3 sec hold - Seated Trunk Rotation  - 2 x daily - 7 x weekly - 1 sets - 10 reps - Seated Quadratus Lumborum Stretch in Chair  - 2 x daily - 7 x weekly - 1 sets - 10 reps  ASSESSMENT:  CLINICAL IMPRESSION: Overall, caregiver reports Dorance is complaining less about back pain and is walking straighter.  Due to cognitive impairments, Deaunte has trouble expressing how much improvement overall and what is bothering him the most.  Today due to time constraints, did not fully reassess all goals, but overall noted improved posture, he was able to bend forward to touch toes after manual therapy without pain, and had not difficulty on stairs other than he was rushing and did not listen to instructions.  He did report slightly more pain today and noted more trigger points  and discomfort in R lumbar multifidi, improved after manual therapy and TrDN.   [name] continues to demonstrate potential for improvement and would benefit from continued skilled therapy to address impairments.      OBJECTIVE IMPAIRMENTS: Abnormal gait, decreased activity tolerance, decreased balance, decreased endurance, decreased knowledge of condition, difficulty walking, decreased ROM, decreased strength, hypomobility, increased fascial restrictions, increased muscle spasms, postural dysfunction, and pain.   ACTIVITY LIMITATIONS: carrying, lifting, bending, standing, sleeping, and locomotion level  PARTICIPATION LIMITATIONS: community activity and yard work  PERSONAL FACTORS: Time since onset  of injury/illness/exacerbation and 3+ comorbidities: ADHD, autism, fragile X, HTN, hypothyroidism, osteopenia, thrombocytopenia,  lumbar spondylosis  are also affecting patient's functional outcome.   REHAB POTENTIAL: Good  CLINICAL DECISION MAKING: Evolving/moderate complexity  EVALUATION COMPLEXITY: Moderate   GOALS: Goals reviewed with patient? Yes  SHORT TERM GOALS: Target date: 06/14/2023   Patient will be independent with initial HEP.  Baseline: needs Goal status: MET 06/09/23 - HEP given.  06/19/23- met   LONG TERM GOALS: Target date: 07/26/2023  extended to 09/06/23  Patient will be independent with advanced/ongoing HEP to improve outcomes and carryover.  Baseline:  Goal status: IN PROGRESS 07/26/23 - met for current.   2.  Patient will report 75% improvement in low back pain to improve QOL.  Baseline:  Goal status: IN PROGRESS 07/26/23- maybe a little improvement per caregiver  3.  Patient will demonstrate full pain free lumbar ROM to perform ADLs.   Baseline: pain with bending over Goal status: IN PROGRESS 07/26/23- no pain today after MT 08/30/23- reported no pain with lumbar AROM  4.  Patient will demonstrate improved functional strength as demonstrated by 5x STS < 17 seconds. Baseline: 21.9 seconds, intermittant UE support Goal status: IN PROGRESS  5.  Patient will report at least 6 points improvement on modified Oswestry to demonstrate improved functional ability.  Baseline: 13/50 Goal status: IN PROGRESS   6.  Patient will tolerate 1 hour  of standing to participate in special olympics activities. Baseline: increased pain with prolonged standing Goal status: IN PROGRESS  07/26/23- still has pain with standing.   7.   Patient will demonstrate at least 19/24 on DGI to decrease fall risk.  Baseline: NT Goal status: IN PROGRESS 07/03/23 18/24   PLAN:  PT FREQUENCY: 1-2x/week  PT DURATION: 6 weeks  PLANNED INTERVENTIONS: Therapeutic exercises, Therapeutic  activity, Neuromuscular re-education, Balance training, Gait training, Patient/Family education, Self Care, Joint mobilization, Stair training, Prosthetic training, Dry Needling, Electrical stimulation, Spinal manipulation, Spinal mobilization, Cryotherapy, Moist heat, Taping, Traction, Ultrasound, Manual therapy, and Re-evaluation.  PLAN FOR NEXT SESSION: continue to progress core strengthening,  manual therapy, assess response to TrDN   Jena Gauss, PT, DPT 08/30/2023, 11:36 AM

## 2023-09-06 ENCOUNTER — Ambulatory Visit: Payer: Medicare Other | Admitting: Physical Therapy

## 2023-09-15 ENCOUNTER — Ambulatory Visit: Payer: Medicare Other | Admitting: Physical Therapy

## 2023-09-15 ENCOUNTER — Encounter: Payer: Self-pay | Admitting: Physical Therapy

## 2023-09-15 DIAGNOSIS — M5459 Other low back pain: Secondary | ICD-10-CM

## 2023-09-15 DIAGNOSIS — M6281 Muscle weakness (generalized): Secondary | ICD-10-CM

## 2023-09-15 DIAGNOSIS — R252 Cramp and spasm: Secondary | ICD-10-CM

## 2023-09-15 NOTE — Therapy (Signed)
OUTPATIENT PHYSICAL THERAPY REEVALUATION/RECERT   Patient Name: Justin Kerr MRN: [id number] DOB:03-14-1966, 57 y.o., male Today's Date: 09/15/2023  END OF SESSION:  PT End of Session - 09/15/23 1104     Visit Number 11    Date for PT Re-Evaluation 09/06/23    Authorization Type Medicare, Medicaid + [employer]    Progress Note Due on Visit 10    PT Start Time 1103    PT Stop Time 1150    PT Time Calculation (min) 47 min    Activity Tolerance Patient tolerated treatment well    Behavior During Therapy WFL for tasks assessed/performed             Past Medical History:  Diagnosis Date   ADHD    Anxiety    Autism    Fragile X syndrome    Hypertension    Hypertriglyceridemia    Hypothyroidism    acquired   Lumbar spondylosis    Osteopenia    Thrombocytopenia (HCC)    Urge incontinence of urine    Vitamin D deficiency    Past Surgical History:  Procedure Laterality Date   DENTAL RESTORATION/EXTRACTION WITH X-RAY N/A 04/21/2020   Procedure: DENTAL RESTORATION/EXTRACTION WITH X-RAY fillings in teeth 3, 4, 13, 19, 30, and 31 WITH CLEANING AND FLOURIDE;  Surgeon: Cristino Martes, DMD;  Location: [facility] OR;  Service: Dentistry;  Laterality: N/A;   DENTAL SURGERY     wisdom teeth, dental   ELBOW SURGERY Left    There are no active problems to display for this patient.   PCP: Curt Bears, MD   REFERRING PROVIDER: Leola Brazil, DO   REFERRING DIAG: [phone] (ICD-10-CM) - Spondylosis without myelopathy or radiculopathy, lumbar region   Rationale for Evaluation and Treatment: Rehabilitation  THERAPY DIAG:  Other low back pain  Muscle weakness (generalized)  Cramp and spasm  ONSET DATE: several months ago.   SUBJECTIVE:                                                                                                                                                                                           SUBJECTIVE STATEMENT: Justin Kerr accompanied  by caregiver [name].  Caregiver reports he has been having a lot more back pain lately, having to give him arthritis strength Tylenol.  He hasn't been wanting to participate as much in activities and leaning more to the left.   Can't do much walking now before starts to complain of pain.    PERTINENT HISTORY:  ADHD, autism, fragile X, HTN, hypothyroidism, osteopenia, thrombocytopenia,  lumbar spondylosis  PAIN:  Are you having  pain? Yes: NPRS scale: 4 on FACES scale /10 Pain location: low back Pain description: ache Aggravating factors: standing for a long time Relieving factors: sitting down   PRECAUTIONS: None  RED FLAGS: None   WEIGHT BEARING RESTRICTIONS: No  FALLS:  Has patient fallen in last 6 months? No  LIVING ENVIRONMENT: Lives with: lives with their family and lives in an adult home Lives in: House/apartment Stairs: Yes: Internal: 16 steps; on right going up and on left going up and External: 3 steps; on right going up and on left going up Has following equipment at home: Grab bars  OCCUPATION: attends day program   PLOF: Independent with basic ADLs  PATIENT GOALS: decrease pain, walking better, get ready for bocce  NEXT MD VISIT: as needed  OBJECTIVE:   DIAGNOSTIC FINDINGS:  04/26/23 XR Lumbar spine IMPRESSION:  1. No acute fracture or traumatic malalignment.  2.  Mild multilevel degenerative disease most pronounced at L4-L5 and L5-S1.  3.  Moderate facet arthropathy at L4-L5 and L5-S1   PATIENT SURVEYS:  Modified Oswestry 13/50   COGNITION: Overall cognitive status: History of cognitive impairments - at baseline     SENSATION: Not tested  MUSCLE LENGTH: Hamstrings: moderate tightness bil, R>L  Quads: moderate tightness bil   POSTURE:  thoracolumbar scoliosis with  shif to left and compensatory R head tilt, weight shift to left in sitting and standing , in prone, noted R tibia shorter than L  PALPATION: Tenderness lumbar spine and R QL  LUMBAR ROM:   very sudden movements, can touch toes, painful.   LUMBAR ROM:  09/15/23  Active  A/PROM  eval  Flexion To ankles, pain R side  Extension   Right lateral flexion Pain R side  Left lateral flexion Pain R side  Right rotation No pain  Left rotation Pain R side   (Blank rows = not tested)   LOWER EXTREMITY ROM:      Tightness in L hip ER/IR compared to R hip.   LOWER EXTREMITY MMT:   functionally 4/5, however due to cognitive deficits difficulty following directions for MMT, can stand on toes, could not follow commands to walk on heels, 5/5 knee extension, 4/5 hip flexion.    LUMBAR SPECIAL TESTS:  Straight leg raise test: Negative  FUNCTIONAL TESTS:  5 times sit to stand: 21.9 seconds, intermittent UE assist  09/15/23 22.5 seconds.  Gait speed 0.94 m/s   GAIT: Distance walked: 100' Assistive device utilized: None Level of assistance: Complete Independence Comments: wide BOS, leans to left, turns LLE externally rotated, weight shift to left.    TODAY'S TREATMENT:                                                                                                                              DATE:   09/15/23 Reevaluation  Posture, weight shift, lumbar AROM.  Needed extended time for lumbar AROM. Placed heel lift in R shoe with improved  posture to equalize WB bil.  Therapeutic Exercise: to improve strength and mobility.  Demo, verbal and tactile cues throughout for technique. Quadruped leg extension  - needed excessive cuing, difficulty mainitaining quadruped postioning Standing hip extension - still unable to follow visual, verbal and tactile cues to perform correctly.  Discussed swimming for LE/core strengthening as activity Wilmer enjoys.  Ultrasound: x 8 min to R QL 1 MHz, 1.2 w/cm2 cont to decrease inflammation/pain   08/25/23 Therapeutic Activity:   Nustep L5 x 6 min for warm-up/muscle perfusion Stairs - descending reciprocal step 1 HR, ascending reciprocal step, 2 HR,  rapid pace Lumbar AROM - no pain Manual Therapy: to decrease muscle spasm and pain and improve mobility  STM/TPR to bil glutes, R UPA mobs lumbar spine,  skilled palpation and monitoring during dry needling. Trigger Point Dry-Needling  Treatment instructions: Expect mild to moderate muscle soreness. S/S of pneumothorax if dry needled over a lung field, and to seek immediate medical attention should they occur. Patient verbalized understanding of these instructions and education. Patient Consent Given: Yes Education handout provided: Yes Muscles treated: bil L3-5 lumbar multifidi Electrical stimulation performed: No Parameters: N/A Treatment response/outcome: Twitch Response Elicited and Palpable Increase in Muscle Length  08/23/23 Therapeutic Exercise: to improve strength and mobility.  Demo, verbal and tactile cues throughout for technique. Gait x 6 min - 900' - lean to left, slowed towards end, rated pain 2/10 on FACES Nustep L6 x 5 min  In prone - Prone knee bends x 10 each side - needed tactile cues initially then verbal cues Prone press-ups x 10 - needed max tactile and verbal cues for sequencing Manual Therapy: to decrease muscle spasm and pain and improve mobility Percussive devise to  bil QL,  bil lumbar erector spinae,  STM/TPR to bil glutes, L UPA mobs lumbar spine,  skilled palpation and monitoring during dry needling. Trigger Point Dry-Needling  Treatment instructions: Expect mild to moderate muscle soreness. S/S of pneumothorax if dry needled over a lung field, and to seek immediate medical attention should they occur. Patient verbalized understanding of these instructions and education. Patient Consent Given: Yes Education handout provided: Yes Muscles treated: bil L3-5 lumbar multifidi Electrical stimulation performed: No Parameters: N/A Treatment response/outcome: Twitch Response Elicited and Palpable Increase in Muscle Length   08/09/23  NuStep L6 x 6 min  Prone leg  extensions - max assist needed today Bridges 2 x 10 Manual Therapy: to decrease muscle spasm and pain and improve mobility Percussive devise to  L QL, L lumbar erector spinae,  and R glutes, L UPA mobs lumbar spine,  skilled palpation and monitoring during dry needling. Trigger Point Dry-Needling  Treatment instructions: Expect mild to moderate muscle soreness. S/S of pneumothorax if dry needled over a lung field, and to seek immediate medical attention should they occur. Patient verbalized understanding of these instructions and education. Patient Consent Given: Yes Education handout provided: Yes Muscles treated: L L3-5 lumbar multifidi, R glutes Electrical stimulation performed: No Parameters: N/A Treatment response/outcome: Twitch Response Elicited and Palpable Increase   PATIENT EDUCATION:  Education details: continue HEP as tolerated Person educated: Patient and Publishing rights manager Education method: Explanation Education comprehension: verbalized understanding  HOME EXERCISE PROGRAM: Access Code: [email or web address] URL: [email or web address] Date: 06/14/2023 Prepared by: Harrie Foreman  Exercises - Left Standing Lateral Shift Correction at Wall - Repetitions  - 1 x daily - 7 x weekly - 2 sets - 10 reps - 3 sec hold - Supine Bridge  - 1 x  daily - 7 x weekly - 2 sets - 10 reps - 3 sec hold - Seated Trunk Rotation  - 2 x daily - 7 x weekly - 1 sets - 10 reps - Seated Quadratus Lumborum Stretch in Chair  - 2 x daily - 7 x weekly - 1 sets - 10 reps  ASSESSMENT:  CLINICAL IMPRESSION: Caregiver reports Rudie has been complaining of more back pain, participating less and leaning more to left.  Reevaluated today, Kaven consistently complains of pain in R QL with all lumbar AROM and has limited lumbar AROM due to significant scoliosis.  Explained scoliosis and affect on posture to caregiver.  Continued to try to work on exercises for [place] but due to cognitive impairments Layn has  significant difficulties following instructions and performing exercises with good technique.  He does enjoy swimming, discussed return to swimming with family who has access to indoor pool and with 1-on-1 for strengthening.   Also placed heel lift in R shoe (new shoes) to help shift weight equally between left and right to improve posture.    After reevaluation and exercises, reported increased R sided LBP so applied [place] which decreased pain.  Justin Kerr continues to demonstrate potential for improvement and would benefit from continued skilled therapy to address impairments.  Recommend additional therapy for 1x/week for 8 more weeks.      OBJECTIVE IMPAIRMENTS: Abnormal gait, decreased activity tolerance, decreased balance, decreased endurance, decreased knowledge of condition, difficulty walking, decreased ROM, decreased strength, hypomobility, increased fascial restrictions, increased muscle spasms, postural dysfunction, and pain.   ACTIVITY LIMITATIONS: carrying, lifting, bending, standing, sleeping, and locomotion level  PARTICIPATION LIMITATIONS: community activity and yard work  PERSONAL FACTORS: Time since onset of injury/illness/exacerbation and 3+ comorbidities: ADHD, autism, fragile X, HTN, hypothyroidism, osteopenia, thrombocytopenia,  lumbar spondylosis  are also affecting patient's functional outcome.   REHAB POTENTIAL: Good  CLINICAL DECISION MAKING: Evolving/moderate complexity  EVALUATION COMPLEXITY: Moderate   GOALS: Goals reviewed with patient? Yes  SHORT TERM GOALS: Target date: 06/14/2023   Patient will be independent with initial HEP.  Baseline: needs Goal status: MET 06/09/23 - HEP given.  06/19/23- met   LONG TERM GOALS: Target date: extended to 11/10/2023 Patient will be independent with advanced/ongoing HEP to improve outcomes and carryover.  Baseline:  Goal status: IN PROGRESS 07/26/23 - met for current.   2.  Patient will report 75% improvement in low  back pain to improve QOL.  Baseline:  Goal status: IN PROGRESS 07/26/23- maybe a little improvement per caregiver  3.  Patient will demonstrate full pain free lumbar ROM to perform ADLs.   Baseline: pain with bending over Goal status: IN PROGRESS 07/26/23- no pain today after MT 08/30/23- reported no pain with lumbar AROM  4.  Patient will demonstrate improved functional strength as demonstrated by 5x STS < 17 seconds. Baseline: 21.9 seconds, intermittant UE support Goal status: IN PROGRESS  5.  Patient will report at least 6 points improvement on modified Oswestry to demonstrate improved functional ability.  Baseline: 13/50 Goal status: IN PROGRESS   6.  Patient will tolerate 1 hour  of standing to participate in special olympics activities. Baseline: increased pain with prolonged standing Goal status: IN PROGRESS  07/26/23- still has pain with standing.   7.   Patient will demonstrate at least 19/24 on DGI to decrease fall risk.  Baseline: NT Goal status: IN PROGRESS 07/03/23 18/24   PLAN:  PT FREQUENCY: 1x/week  PT DURATION: 8 weeks  PLANNED INTERVENTIONS: Therapeutic exercises, Therapeutic activity, Neuromuscular re-education, Balance training, Gait training, Patient/Family education, Self Care, Joint mobilization, Stair training, Prosthetic training, Dry Needling, Electrical stimulation, Spinal manipulation, Spinal mobilization, Cryotherapy, Moist heat, Taping, Traction, Ultrasound, Manual therapy, and Re-evaluation.  PLAN FOR NEXT SESSION: continue to progress core strengthening,  manual therapy, assess response to TrDN   Jena Gauss, PT, DPT 09/15/2023, 12:18 PM

## 2023-10-05 ENCOUNTER — Ambulatory Visit: Payer: Medicare Other | Attending: Internal Medicine | Admitting: Physical Therapy

## 2023-10-05 ENCOUNTER — Encounter: Payer: Self-pay | Admitting: Physical Therapy

## 2023-10-05 DIAGNOSIS — R252 Cramp and spasm: Secondary | ICD-10-CM | POA: Insufficient documentation

## 2023-10-05 DIAGNOSIS — M5459 Other low back pain: Secondary | ICD-10-CM | POA: Diagnosis present

## 2023-10-05 DIAGNOSIS — M6281 Muscle weakness (generalized): Secondary | ICD-10-CM | POA: Diagnosis present

## 2023-10-05 NOTE — Therapy (Signed)
OUTPATIENT PHYSICAL THERAPY TREATMENT   Patient Name: Justin Kerr MRN: [id number] DOB:07-19-1966, 58 y.o., male Today's Date: 10/05/2023  END OF SESSION:  PT End of Session - 10/05/23 1022     Visit Number 12    Date for PT Re-Evaluation 11/10/23    Authorization Type Medicare, Medicaid + [employer] for [employer]    Progress Note Due on Visit 19    PT Start Time 1022    PT Stop Time 1108    PT Time Calculation (min) 46 min    Activity Tolerance Patient tolerated treatment well    Behavior During Therapy WFL for tasks assessed/performed             Past Medical History:  Diagnosis Date   ADHD    Anxiety    Autism    Fragile X syndrome    Hypertension    Hypertriglyceridemia    Hypothyroidism    acquired   Lumbar spondylosis    Osteopenia    Thrombocytopenia (HCC)    Urge incontinence of urine    Vitamin D deficiency    Past Surgical History:  Procedure Laterality Date   DENTAL RESTORATION/EXTRACTION WITH X-RAY N/A 04/21/2020   Procedure: DENTAL RESTORATION/EXTRACTION WITH X-RAY fillings in teeth 3, 4, 13, 19, 30, and 31 WITH CLEANING AND FLOURIDE;  Surgeon: Cristino Martes, DMD;  Location: [facility] OR;  Service: Dentistry;  Laterality: N/A;   DENTAL SURGERY     wisdom teeth, dental   ELBOW SURGERY Left    There are no active problems to display for this patient.   PCP: Curt Bears, MD   REFERRING PROVIDER: Leola Brazil, DO   REFERRING DIAG: [phone] (ICD-10-CM) - Spondylosis without myelopathy or radiculopathy, lumbar region   Rationale for Evaluation and Treatment: Rehabilitation  THERAPY DIAG:  Other low back pain  Muscle weakness (generalized)  Cramp and spasm  ONSET DATE: several months ago.   SUBJECTIVE:                                                                                                                                                                                           SUBJECTIVE STATEMENT: Justin Kerr accompanied by  caregiver [name].  Reports he was staggering a lot this morning and last couple of days on Left leg.   PERTINENT HISTORY:  ADHD, autism, fragile X, HTN, hypothyroidism, osteopenia, thrombocytopenia,  lumbar spondylosis  PAIN:  Are you having pain? Yes: NPRS scale: 4 on FACES scale /10 Pain location: low back Pain description: ache Aggravating factors: standing for a long time Relieving factors: sitting down   PRECAUTIONS: None  RED FLAGS:  None   WEIGHT BEARING RESTRICTIONS: No  FALLS:  Has patient fallen in last 6 months? No  LIVING ENVIRONMENT: Lives with: lives with their family and lives in an adult home Lives in: House/apartment Stairs: Yes: Internal: 16 steps; on right going up and on left going up and External: 3 steps; on right going up and on left going up Has following equipment at home: Grab bars  OCCUPATION: attends day program   PLOF: Independent with basic ADLs  PATIENT GOALS: decrease pain, walking better, get ready for bocce  NEXT MD VISIT: as needed  OBJECTIVE:   DIAGNOSTIC FINDINGS:  04/26/23 XR Lumbar spine IMPRESSION:  1. No acute fracture or traumatic malalignment.  2.  Mild multilevel degenerative disease most pronounced at L4-L5 and L5-S1.  3.  Moderate facet arthropathy at L4-L5 and L5-S1   PATIENT SURVEYS:  Modified Oswestry 13/50   COGNITION: Overall cognitive status: History of cognitive impairments - at baseline     SENSATION: Not tested  MUSCLE LENGTH: Hamstrings: moderate tightness bil, R>L  Quads: moderate tightness bil   POSTURE:  thoracolumbar scoliosis with  shif to left and compensatory R head tilt, weight shift to left in sitting and standing , in prone, noted R tibia shorter than L  PALPATION: Tenderness lumbar spine and R QL  LUMBAR ROM:  very sudden movements, can touch toes, painful.   LUMBAR ROM:  09/15/23  Active  A/PROM  eval  Flexion To ankles, pain R side  Extension   Right lateral flexion Pain R side  Left  lateral flexion Pain R side  Right rotation No pain  Left rotation Pain R side   (Blank rows = not tested)   LOWER EXTREMITY ROM:      Tightness in L hip ER/IR compared to R hip.   LOWER EXTREMITY MMT:   functionally 4/5, however due to cognitive deficits difficulty following directions for MMT, can stand on toes, could not follow commands to walk on heels, 5/5 knee extension, 4/5 hip flexion.    LUMBAR SPECIAL TESTS:  Straight leg raise test: Negative  FUNCTIONAL TESTS:  5 times sit to stand: 21.9 seconds, intermittent UE assist  09/15/23 22.5 seconds.  Gait speed 0.94 m/s   GAIT: Distance walked: 100' Assistive device utilized: None Level of assistance: Complete Independence Comments: wide BOS, leans to left, turns LLE externally rotated, weight shift to left.    TODAY'S TREATMENT:                                                                                                                              DATE:   10/05/2023 Therapeutic Exercise: to improve strength and mobility.  Demo, verbal and tactile cues throughout for technique. Bike L2 x 6 min - modA needed for reciprocal movements especially for LLE, otherwise difficulty keeping LLE on pedal Standing QL stretches against wall - again needed max cuing, tactile cues and demo.  Moved to quieter room.  Leg extensions - while leaning over table, modA  x 10 R/L - needed to block 1 leg to keep on ground and tactile cues to engage glutes, still difficult.  Bil leg extensions while holding table - improved compliance with exercise, x 10 Manual Therapy: to decrease muscle spasm and pain and improve mobility STM/TPR to L QL, glute med, lumbar multifidi, L UPA mobs lumbar spine, skilled palpation and monitoring during dry needling. Trigger Point Dry Needling  Subsequent Treatment: Instructions provided previously at initial dry needling treatment.   Patient Verbal Consent Given: Yes Education Handout Provided: Previously  Provided Muscles Treated: L L5 multifidi Electrical Stimulation Performed: No Treatment Response/Outcome: Palpable Increase in Muscle Length  09/15/23 Reevaluation  Posture, weight shift, lumbar AROM.  Needed extended time for lumbar AROM. Placed heel lift in R shoe with improved posture to equalize WB bil.  Therapeutic Exercise: to improve strength and mobility.  Demo, verbal and tactile cues throughout for technique. Quadruped leg extension  - needed excessive cuing, difficulty mainitaining quadruped postioning Standing hip extension - still unable to follow visual, verbal and tactile cues to perform correctly.  Discussed swimming for LE/core strengthening as activity Tasheem enjoys.  Ultrasound: x 8 min to R QL 1 MHz, 1.2 w/cm2 cont to decrease inflammation/pain   08/25/23 Therapeutic Activity:   Nustep L5 x 6 min for warm-up/muscle perfusion Stairs - descending reciprocal step 1 HR, ascending reciprocal step, 2 HR, rapid pace Lumbar AROM - no pain Manual Therapy: to decrease muscle spasm and pain and improve mobility  STM/TPR to bil glutes, R UPA mobs lumbar spine,  skilled palpation and monitoring during dry needling. Trigger Point Dry-Needling  Treatment instructions: Expect mild to moderate muscle soreness. S/S of pneumothorax if dry needled over a lung field, and to seek immediate medical attention should they occur. Patient verbalized understanding of these instructions and education. Patient Consent Given: Yes Education handout provided: Yes Muscles treated: bil L3-5 lumbar multifidi Electrical stimulation performed: No Parameters: N/A Treatment response/outcome: Twitch Response Elicited and Palpable Increase in Muscle Length   PATIENT EDUCATION:  Education details: continue HEP as tolerated Person educated: Patient and Publishing rights manager Education method: Explanation Education comprehension: verbalized understanding  HOME EXERCISE PROGRAM: Access Code: [email or web address] URL:  [email or web address] Date: 06/14/2023 Prepared by: Harrie Foreman  Exercises - Left Standing Lateral Shift Correction at Wall - Repetitions  - 1 x daily - 7 x weekly - 2 sets - 10 reps - 3 sec hold - Supine Bridge  - 1 x daily - 7 x weekly - 2 sets - 10 reps - 3 sec hold - Seated Trunk Rotation  - 2 x daily - 7 x weekly - 1 sets - 10 reps - Seated Quadratus Lumborum Stretch in Chair  - 2 x daily - 7 x weekly - 1 sets - 10 reps  ASSESSMENT:  CLINICAL IMPRESSION: Caregiver reports Daanish has been staggering lately and demonstrating more difficulty with LLE.  Discussed making appt. With Dr. Antonietta Barcelona, as LBP continues to be problem and may need further intervention.  It is also complicated by his scoliosis/torticollis.  Continued to work with Jonny Ruiz for strengthening, he still needs mod/max cuing and tactile cues to complete exercises, today very distracted by busy clinic so taken to room to complete, but still difficult.  He did consent to try TrDN again, which he tolerated well.  Reported decreased pain from 4 to 2 on FACES scale. Justin Kerr continues to demonstrate potential for improvement and would benefit from  continued skilled therapy to address impairments.     OBJECTIVE IMPAIRMENTS: Abnormal gait, decreased activity tolerance, decreased balance, decreased endurance, decreased knowledge of condition, difficulty walking, decreased ROM, decreased strength, hypomobility, increased fascial restrictions, increased muscle spasms, postural dysfunction, and pain.   ACTIVITY LIMITATIONS: carrying, lifting, bending, standing, sleeping, and locomotion level  PARTICIPATION LIMITATIONS: community activity and yard work  PERSONAL FACTORS: Time since onset of injury/illness/exacerbation and 3+ comorbidities: ADHD, autism, fragile X, HTN, hypothyroidism, osteopenia, thrombocytopenia,  lumbar spondylosis  are also affecting patient's functional outcome.   REHAB POTENTIAL: Good  CLINICAL  DECISION MAKING: Evolving/moderate complexity  EVALUATION COMPLEXITY: Moderate   GOALS: Goals reviewed with patient? Yes  SHORT TERM GOALS: Target date: 06/14/2023   Patient will be independent with initial HEP.  Baseline: needs Goal status: MET 06/09/23 - HEP given.  06/19/23- met   LONG TERM GOALS: Target date: extended to 11/10/2023 Patient will be independent with advanced/ongoing HEP to improve outcomes and carryover.  Baseline:  Goal status: IN PROGRESS 07/26/23 - met for current.   2.  Patient will report 75% improvement in low back pain to improve QOL.  Baseline:  Goal status: IN PROGRESS 07/26/23- maybe a little improvement per caregiver  3.  Patient will demonstrate full pain free lumbar ROM to perform ADLs.   Baseline: pain with bending over Goal status: IN PROGRESS 07/26/23- no pain today after MT 08/30/23- reported no pain with lumbar AROM  4.  Patient will demonstrate improved functional strength as demonstrated by 5x STS < 17 seconds. Baseline: 21.9 seconds, intermittant UE support Goal status: IN PROGRESS  5.  Patient will report at least 6 points improvement on modified Oswestry to demonstrate improved functional ability.  Baseline: 13/50 Goal status: IN PROGRESS   6.  Patient will tolerate 1 hour  of standing to participate in special olympics activities. Baseline: increased pain with prolonged standing Goal status: IN PROGRESS  07/26/23- still has pain with standing.   7.   Patient will demonstrate at least 19/24 on DGI to decrease fall risk.  Baseline: NT Goal status: IN PROGRESS 07/03/23 18/24   PLAN:  PT FREQUENCY: 1x/week  PT DURATION: 8 weeks  PLANNED INTERVENTIONS: Therapeutic exercises, Therapeutic activity, Neuromuscular re-education, Balance training, Gait training, Patient/Family education, Self Care, Joint mobilization, Stair training, Prosthetic training, Dry Needling, Electrical stimulation, Spinal manipulation, Spinal mobilization,  Cryotherapy, Moist heat, Taping, Traction, Ultrasound, Manual therapy, and Re-evaluation.  PLAN FOR NEXT SESSION: continue to progress core strengthening,  manual therapy.   Jena Gauss, PT, DPT 10/05/2023, 5:14 PM

## 2023-10-10 ENCOUNTER — Ambulatory Visit: Payer: Medicare Other | Admitting: Physical Therapy

## 2023-10-10 ENCOUNTER — Encounter: Payer: Self-pay | Admitting: Physical Therapy

## 2023-10-10 DIAGNOSIS — M6281 Muscle weakness (generalized): Secondary | ICD-10-CM

## 2023-10-10 DIAGNOSIS — M5459 Other low back pain: Secondary | ICD-10-CM

## 2023-10-10 DIAGNOSIS — R252 Cramp and spasm: Secondary | ICD-10-CM

## 2023-10-10 NOTE — Therapy (Signed)
OUTPATIENT PHYSICAL THERAPY TREATMENT   Patient Name: Justin Kerr MRN: [id number] DOB:10/20/1965, 58 y.o., male Today's Date: 10/10/2023  END OF SESSION:  PT End of Session - 10/10/23 0933     Visit Number 13    Date for PT Re-Evaluation 11/10/23    Authorization Type Medicare, Medicaid + Tricare for Life    Progress Note Due on Visit 19    PT Start Time 0931    PT Stop Time 1015    PT Time Calculation (min) 44 min    Activity Tolerance Patient tolerated treatment well    Behavior During Therapy WFL for tasks assessed/performed             Past Medical History:  Diagnosis Date   ADHD    Anxiety    Autism    Fragile X syndrome    Hypertension    Hypertriglyceridemia    Hypothyroidism    acquired   Lumbar spondylosis    Osteopenia    Thrombocytopenia (HCC)    Urge incontinence of urine    Vitamin D deficiency    Past Surgical History:  Procedure Laterality Date   DENTAL RESTORATION/EXTRACTION WITH X-RAY N/A 04/21/2020   Procedure: DENTAL RESTORATION/EXTRACTION WITH X-RAY fillings in teeth 3, 4, 13, 19, 30, and 31 WITH CLEANING AND FLOURIDE;  Surgeon: Cristino Martes, DMD;  Location: [facility] OR;  Service: Dentistry;  Laterality: N/A;   DENTAL SURGERY     wisdom teeth, dental   ELBOW SURGERY Left    There are no active problems to display for this patient.   PCP: Curt Bears, MD   REFERRING PROVIDER: Leola Brazil, DO   REFERRING DIAG: [phone] (ICD-10-CM) - Spondylosis without myelopathy or radiculopathy, lumbar region   Rationale for Evaluation and Treatment: Rehabilitation  THERAPY DIAG:  Other low back pain  Muscle weakness (generalized)  Cramp and spasm  ONSET DATE: several months ago.   SUBJECTIVE:                                                                                                                                                                                           SUBJECTIVE STATEMENT: Justin Kerr accompanied by  caregiver Justin Kerr.  Ohm reports he is doing well but chose 4 on FACES scale.  Justin Kerr reports [name] has been trying to do exercises at home.    PERTINENT HISTORY:  ADHD, autism, fragile X, HTN, hypothyroidism, osteopenia, thrombocytopenia,  lumbar spondylosis  PAIN:  Are you having pain? Yes: NPRS scale: 4 on FACES scale /10 Pain location: low back Pain description: ache Aggravating factors: standing for a long time Relieving factors:  sitting down   PRECAUTIONS: None  RED FLAGS: None   WEIGHT BEARING RESTRICTIONS: No  FALLS:  Has patient fallen in last 6 months? No  LIVING ENVIRONMENT: Lives with: lives with their family and lives in an adult home Lives in: House/apartment Stairs: Yes: Internal: 16 steps; on right going up and on left going up and External: 3 steps; on right going up and on left going up Has following equipment at home: Grab bars  OCCUPATION: attends day program   PLOF: Independent with basic ADLs  PATIENT GOALS: decrease pain, walking better, get ready for bocce  NEXT MD VISIT: as needed  OBJECTIVE:   DIAGNOSTIC FINDINGS:  04/26/23 XR Lumbar spine IMPRESSION:  1. No acute fracture or traumatic malalignment.  2.  Mild multilevel degenerative disease most pronounced at L4-L5 and L5-S1.  3.  Moderate facet arthropathy at L4-L5 and L5-S1   PATIENT SURVEYS:  Modified Oswestry 13/50   COGNITION: Overall cognitive status: History of cognitive impairments - at baseline     SENSATION: Not tested  MUSCLE LENGTH: Hamstrings: moderate tightness bil, R>L  Quads: moderate tightness bil   POSTURE:  thoracolumbar scoliosis with  shif to left and compensatory R head tilt, weight shift to left in sitting and standing , in prone, noted R tibia shorter than L  PALPATION: Tenderness lumbar spine and R QL  LUMBAR ROM:  very sudden movements, can touch toes, painful.   LUMBAR ROM:  09/15/23  Active  A/PROM  eval  Flexion To ankles, pain R side  Extension    Right lateral flexion Pain R side  Left lateral flexion Pain R side  Right rotation No pain  Left rotation Pain R side   (Blank rows = not tested)   LOWER EXTREMITY ROM:      Tightness in L hip ER/IR compared to R hip.   LOWER EXTREMITY MMT:   functionally 4/5, however due to cognitive deficits difficulty following directions for MMT, can stand on toes, could not follow commands to walk on heels, 5/5 knee extension, 4/5 hip flexion.    LUMBAR SPECIAL TESTS:  Straight leg raise test: Negative  FUNCTIONAL TESTS:  5 times sit to stand: 21.9 seconds, intermittent UE assist  09/15/23 22.5 seconds.  Gait speed 0.94 m/s   GAIT: Distance walked: 100' Assistive device utilized: None Level of assistance: Complete Independence Comments: wide BOS, leans to left, turns LLE externally rotated, weight shift to left.    TODAY'S TREATMENT:                                                                                                                              DATE:   10/10/2023 Therapeutic Exercise: to improve strength and mobility.  Demo, verbal and tactile cues throughout for technique.  Nustep L5 x 6 min  Leg extensions - while leaning over table, minA  x 10 R/L -  Bil leg extensions while holding table x 10 Supine bridges  2 x 10 Supine SLR 2 x 10 R/L - min/modA needed to keep knees straight HS stretches with strap - modA x 2 min each side Manual Therapy: to decrease muscle spasm and pain and improve mobility STM/TPR to R QL, glute med, percussive massage to R glutes and QL, skilled palpation and monitoring during dry needling. Trigger Point Dry Needling  Subsequent Treatment: Instructions provided previously at initial dry needling treatment.   Patient Verbal Consent Given: Yes Education Handout Provided: Previously Provided Muscles Treated: R glute med Electrical Stimulation Performed: No Treatment Response/Outcome: Palpable Increase in Muscle Length Modalities: MHP to back in  sitting after session while waiting for caregiver.  (Caregiver had next appt. So remained in room during appt.)   10/05/2023 Therapeutic Exercise: to improve strength and mobility.  Demo, verbal and tactile cues throughout for technique. Bike L2 x 6 min - modA needed for reciprocal movements especially for LLE, otherwise difficulty keeping LLE on pedal Standing QL stretches against wall - again needed max cuing, tactile cues and demo.  Moved to quieter room.  Leg extensions - while leaning over table, modA  x 10 R/L - needed to block 1 leg to keep on ground and tactile cues to engage glutes, still difficult.  Bil leg extensions while holding table - improved compliance with exercise, x 10 Manual Therapy: to decrease muscle spasm and pain and improve mobility STM/TPR to L QL, glute med, lumbar multifidi, L UPA mobs lumbar spine, skilled palpation and monitoring during dry needling. Trigger Point Dry Needling  Subsequent Treatment: Instructions provided previously at initial dry needling treatment.   Patient Verbal Consent Given: Yes Education Handout Provided: Previously Provided Muscles Treated: L L5 multifidi Electrical Stimulation Performed: No Treatment Response/Outcome: Palpable Increase in Muscle Length  09/15/23 Reevaluation  Posture, weight shift, lumbar AROM.  Needed extended time for lumbar AROM. Placed heel lift in R shoe with improved posture to equalize WB bil.  Therapeutic Exercise: to improve strength and mobility.  Demo, verbal and tactile cues throughout for technique. Quadruped leg extension  - needed excessive cuing, difficulty mainitaining quadruped postioning Standing hip extension - still unable to follow visual, verbal and tactile cues to perform correctly.  Discussed swimming for LE/core strengthening as activity Hamish enjoys.  Ultrasound: x 8 min to R QL 1 MHz, 1.2 w/cm2 cont to decrease inflammation/pain    PATIENT EDUCATION:  Education details: continue HEP as  tolerated Person educated: Patient and Publishing rights manager Education method: Explanation Education comprehension: verbalized understanding  HOME EXERCISE PROGRAM: Access Code: [email or web address] URL: [email or web address] Date: 06/14/2023 Prepared by: Harrie Foreman  Exercises - Left Standing Lateral Shift Correction at Wall - Repetitions  - 1 x daily - 7 x weekly - 2 sets - 10 reps - 3 sec hold - Supine Bridge  - 1 x daily - 7 x weekly - 2 sets - 10 reps - 3 sec hold - Seated Trunk Rotation  - 2 x daily - 7 x weekly - 1 sets - 10 reps - Seated Quadratus Lumborum Stretch in Chair  - 2 x daily - 7 x weekly - 1 sets - 10 reps  ASSESSMENT:  CLINICAL IMPRESSION: Vahan reported pain on the R side of back today instead of Left.  He had a band of tightness along R glutes, and while he requested "needles" tolerated poorly so discontinued and switched to percussive device which he tolerated much better.  Overall he was able to perform exercises today with less assistance than last session but  still required tactile cueing and still has glute weakness, fatiguing quickly.  Justin Kerr continues to demonstrate potential for improvement and would benefit from continued skilled therapy to address impairments.     OBJECTIVE IMPAIRMENTS: Abnormal gait, decreased activity tolerance, decreased balance, decreased endurance, decreased knowledge of condition, difficulty walking, decreased ROM, decreased strength, hypomobility, increased fascial restrictions, increased muscle spasms, postural dysfunction, and pain.   ACTIVITY LIMITATIONS: carrying, lifting, bending, standing, sleeping, and locomotion level  PARTICIPATION LIMITATIONS: community activity and yard work  PERSONAL FACTORS: Time since onset of injury/illness/exacerbation and 3+ comorbidities: ADHD, autism, fragile X, HTN, hypothyroidism, osteopenia, thrombocytopenia,  lumbar spondylosis  are also affecting patient's functional outcome.    REHAB POTENTIAL: Good  CLINICAL DECISION MAKING: Evolving/moderate complexity  EVALUATION COMPLEXITY: Moderate   GOALS: Goals reviewed with patient? Yes  SHORT TERM GOALS: Target date: 06/14/2023   Patient will be independent with initial HEP.  Baseline: needs Goal status: MET 06/09/23 - HEP given.  06/19/23- met   LONG TERM GOALS: Target date: extended to 11/10/2023 Patient will be independent with advanced/ongoing HEP to improve outcomes and carryover.  Baseline:  Goal status: IN PROGRESS 07/26/23 - met for current.   2.  Patient will report 75% improvement in low back pain to improve QOL.  Baseline:  Goal status: IN PROGRESS 07/26/23- maybe a little improvement per caregiver  3.  Patient will demonstrate full pain free lumbar ROM to perform ADLs.   Baseline: pain with bending over Goal status: IN PROGRESS 07/26/23- no pain today after MT 08/30/23- reported no pain with lumbar AROM  4.  Patient will demonstrate improved functional strength as demonstrated by 5x STS < 17 seconds. Baseline: 21.9 seconds, intermittant UE support Goal status: IN PROGRESS  5.  Patient will report at least 6 points improvement on modified Oswestry to demonstrate improved functional ability.  Baseline: 13/50 Goal status: IN PROGRESS   6.  Patient will tolerate 1 hour  of standing to participate in special olympics activities. Baseline: increased pain with prolonged standing Goal status: IN PROGRESS  07/26/23- still has pain with standing.   7.   Patient will demonstrate at least 19/24 on DGI to decrease fall risk.  Baseline: NT Goal status: IN PROGRESS 07/03/23 18/24   PLAN:  PT FREQUENCY: 1x/week  PT DURATION: 8 weeks  PLANNED INTERVENTIONS: Therapeutic exercises, Therapeutic activity, Neuromuscular re-education, Balance training, Gait training, Patient/Family education, Self Care, Joint mobilization, Stair training, Prosthetic training, Dry Needling, Electrical stimulation, Spinal  manipulation, Spinal mobilization, Cryotherapy, Moist heat, Taping, Traction, Ultrasound, Manual therapy, and Re-evaluation.  PLAN FOR NEXT SESSION: continue to progress core strengthening,  manual therapy.   Jena Gauss, PT, DPT 10/10/2023, 12:33 PM

## 2023-10-17 ENCOUNTER — Ambulatory Visit: Payer: Medicare Other | Admitting: Physical Therapy

## 2023-10-17 ENCOUNTER — Encounter: Payer: Self-pay | Admitting: Physical Therapy

## 2023-10-17 DIAGNOSIS — R252 Cramp and spasm: Secondary | ICD-10-CM

## 2023-10-17 DIAGNOSIS — M5459 Other low back pain: Secondary | ICD-10-CM

## 2023-10-17 DIAGNOSIS — M6281 Muscle weakness (generalized): Secondary | ICD-10-CM

## 2023-10-17 NOTE — Therapy (Signed)
OUTPATIENT PHYSICAL THERAPY TREATMENT   Patient Name: Justin Kerr MRN: [id number] DOB:05-07-1966, 58 y.o., male Today's Date: 10/17/2023  END OF SESSION:  PT End of Session - 10/17/23 1024     Visit Number 14    Date for PT Re-Evaluation 11/10/23    Authorization Type Medicare, Medicaid + [employer] for [employer]    Progress Note Due on Visit 19    PT Start Time 1022    PT Stop Time 1105    PT Time Calculation (min) 43 min    Activity Tolerance Patient tolerated treatment well    Behavior During Therapy WFL for tasks assessed/performed             Past Medical History:  Diagnosis Date   ADHD    Anxiety    Autism    Fragile X syndrome    Hypertension    Hypertriglyceridemia    Hypothyroidism    acquired   Lumbar spondylosis    Osteopenia    Thrombocytopenia (HCC)    Urge incontinence of urine    Vitamin D deficiency    Past Surgical History:  Procedure Laterality Date   DENTAL RESTORATION/EXTRACTION WITH X-RAY N/A 04/21/2020   Procedure: DENTAL RESTORATION/EXTRACTION WITH X-RAY fillings in teeth 3, 4, 13, 19, 30, and 31 WITH CLEANING AND FLOURIDE;  Surgeon: Justin Kerr, DMD;  Location: [facility] OR;  Service: Dentistry;  Laterality: N/A;   DENTAL SURGERY     wisdom teeth, dental   ELBOW SURGERY Left    There are no active problems to display for this patient.   PCP: Justin Brazil, DO  REFERRING PROVIDER: Leola Brazil, DO   REFERRING DIAG: [phone] (ICD-10-CM) - Spondylosis without myelopathy or radiculopathy, lumbar region   Rationale for Evaluation and Treatment: Rehabilitation  THERAPY DIAG:  Other low back pain  Muscle weakness (generalized)  Cramp and spasm  ONSET DATE: several months ago.   SUBJECTIVE:                                                                                                                                                                                           SUBJECTIVE STATEMENT: Justin Kerr accompanied by  caregiver Justin Kerr.  Justin Kerr reports he is the "leaning tower of [place]" today.  Requested referral to [facility] through his PCP.  Jashun reports he is doing well but chose 4 on FACES scale.    PERTINENT HISTORY:  ADHD, autism, fragile X, HTN, hypothyroidism, osteopenia, thrombocytopenia,  lumbar spondylosis  PAIN:  Are you having pain? Yes: NPRS scale: 4 on FACES scale /10 Pain location: low back Pain description: ache Aggravating factors:  standing for a long time Relieving factors: sitting down   PRECAUTIONS: None  RED FLAGS: None   WEIGHT BEARING RESTRICTIONS: No  FALLS:  Has patient fallen in last 6 months? No  LIVING ENVIRONMENT: Lives with: lives with their family and lives in an adult home Lives in: House/apartment Stairs: Yes: Internal: 16 steps; on right going up and on left going up and External: 3 steps; on right going up and on left going up Has following equipment at home: Grab bars  OCCUPATION: attends day program   PLOF: Independent with basic ADLs  PATIENT GOALS: decrease pain, walking better, get ready for bocce  NEXT MD VISIT: as needed  OBJECTIVE:   DIAGNOSTIC FINDINGS:  04/26/23 XR Lumbar spine IMPRESSION:  1. No acute fracture or traumatic malalignment.  2.  Mild multilevel degenerative disease most pronounced at L4-L5 and L5-S1.  3.  Moderate facet arthropathy at L4-L5 and L5-S1   PATIENT SURVEYS:  Modified Oswestry 13/50   COGNITION: Overall cognitive status: History of cognitive impairments - at baseline     SENSATION: Not tested  MUSCLE LENGTH: Hamstrings: moderate tightness bil, R>L  Quads: moderate tightness bil   POSTURE:  thoracolumbar scoliosis with  shif to left and compensatory R head tilt, weight shift to left in sitting and standing , in prone, noted R tibia shorter than L  PALPATION: Tenderness lumbar spine and R QL  LUMBAR ROM:  very sudden movements, can touch toes, painful.   LUMBAR ROM:  09/15/23  Active  A/PROM  eval  Flexion  To ankles, pain R side  Extension   Right lateral flexion Pain R side  Left lateral flexion Pain R side  Right rotation No pain  Left rotation Pain R side   (Blank rows = not tested)   LOWER EXTREMITY ROM:     Tightness in L hip ER/IR compared to R hip.   LOWER EXTREMITY MMT:   functionally 4/5, however due to cognitive deficits difficulty following directions for MMT, can stand on toes, could not follow commands to walk on heels, 5/5 knee extension, 4/5 hip flexion.    LUMBAR SPECIAL TESTS:  Straight leg raise test: Negative  FUNCTIONAL TESTS:  5 times sit to stand: 21.9 seconds, intermittent UE assist  09/15/23 22.5 seconds.  Gait speed 0.94 m/s   GAIT: Distance walked: 100' Assistive device utilized: None Level of assistance: Complete Independence Comments: wide BOS, leans to left, turns LLE externally rotated, weight shift to left.    TODAY'S TREATMENT:                                                                                                                              DATE:   10/17/2023 Therapeutic Exercise: to improve strength and mobility.  Demo, verbal and tactile cues throughout for technique. Nustep L5 x 6 min Leg extensions - while leaning over table, x 10 R/L - mod/maxA needed today Leg extensions prone on table -  still needed maxA and tactile cues - trying to engage hip flexors instead of extensiors.  Supine bridges 2 x 10 Seated side stretches over bolster x 1 min each side.  Manual Therapy: to decrease muscle spasm and pain and improve mobility STM/TPR to R QL, glute med, percussive massage to R glutes and QL Modalities: MHP to low back in sitting post session while waiting in room for caregiver.    10/10/2023 Therapeutic Exercise: to improve strength and mobility.  Demo, verbal and tactile cues throughout for technique.  Nustep L5 x 6 min  Leg extensions - while leaning over table, minA  x 10 R/L -  Bil leg extensions while holding table x 10 Supine  bridges 2 x 10 Supine SLR 2 x 10 R/L - min/modA needed to keep knees straight HS stretches with strap - modA x 2 min each side Manual Therapy: to decrease muscle spasm and pain and improve mobility STM/TPR to R QL, glute med, percussive massage to R glutes and QL, skilled palpation and monitoring during dry needling. Trigger Point Dry Needling  Subsequent Treatment: Instructions provided previously at initial dry needling treatment.   Patient Verbal Consent Given: Yes Education Handout Provided: Previously Provided Muscles Treated: R glute med Electrical Stimulation Performed: No Treatment Response/Outcome: Palpable Increase in Muscle Length Modalities: MHP to back in sitting after session while waiting for caregiver.  (Caregiver had next appt. So remained in room during appt.)   10/05/2023 Therapeutic Exercise: to improve strength and mobility.  Demo, verbal and tactile cues throughout for technique. Bike L2 x 6 min - modA needed for reciprocal movements especially for LLE, otherwise difficulty keeping LLE on pedal Standing QL stretches against wall - again needed max cuing, tactile cues and demo.  Moved to quieter room.  Leg extensions - while leaning over table, modA  x 10 R/L - needed to block 1 leg to keep on ground and tactile cues to engage glutes, still difficult.  Bil leg extensions while holding table - improved compliance with exercise, x 10 Manual Therapy: to decrease muscle spasm and pain and improve mobility STM/TPR to L QL, glute med, lumbar multifidi, L UPA mobs lumbar spine, skilled palpation and monitoring during dry needling. Trigger Point Dry Needling  Subsequent Treatment: Instructions provided previously at initial dry needling treatment.   Patient Verbal Consent Given: Yes Education Handout Provided: Previously Provided Muscles Treated: L L5 multifidi Electrical Stimulation Performed: No Treatment Response/Outcome: Palpable Increase in Muscle  Length    PATIENT EDUCATION:  Education details: continue HEP as tolerated Person educated: Patient and Publishing rights manager Education method: Explanation Education comprehension: verbalized understanding  HOME EXERCISE PROGRAM: Access Code: [email or web address] URL: [email or web address] Date: 06/14/2023 Prepared by: Harrie Foreman  Exercises - Left Standing Lateral Shift Correction at Wall - Repetitions  - 1 x daily - 7 x weekly - 2 sets - 10 reps - 3 sec hold - Supine Bridge  - 1 x daily - 7 x weekly - 2 sets - 10 reps - 3 sec hold - Seated Trunk Rotation  - 2 x daily - 7 x weekly - 1 sets - 10 reps - Seated Quadratus Lumborum Stretch in Chair  - 2 x daily - 7 x weekly - 1 sets - 10 reps  ASSESSMENT:  CLINICAL IMPRESSION: Azaiah continues to reported pain on the R side of back.  Continued to try to strengthen back and hip extensors, but this is very challenging for Jasean as tries to engage hip flexors even when  laying prone on table.  Burman declined TrDN.  Reported decreased pain after manual therapy.   Justin Kerr continues to demonstrate potential for improvement and would benefit from continued skilled therapy to address impairments.     OBJECTIVE IMPAIRMENTS: Abnormal gait, decreased activity tolerance, decreased balance, decreased endurance, decreased knowledge of condition, difficulty walking, decreased ROM, decreased strength, hypomobility, increased fascial restrictions, increased muscle spasms, postural dysfunction, and pain.   ACTIVITY LIMITATIONS: carrying, lifting, bending, standing, sleeping, and locomotion level  PARTICIPATION LIMITATIONS: community activity and yard work  PERSONAL FACTORS: Time since onset of injury/illness/exacerbation and 3+ comorbidities: ADHD, autism, fragile X, HTN, hypothyroidism, osteopenia, thrombocytopenia,  lumbar spondylosis  are also affecting patient's functional outcome.   REHAB POTENTIAL: Good  CLINICAL DECISION MAKING:  Evolving/moderate complexity  EVALUATION COMPLEXITY: Moderate   GOALS: Goals reviewed with patient? Yes  SHORT TERM GOALS: Target date: 06/14/2023   Patient will be independent with initial HEP.  Baseline: needs Goal status: MET 06/09/23 - HEP given.  06/19/23- met   LONG TERM GOALS: Target date: extended to 11/10/2023 Patient will be independent with advanced/ongoing HEP to improve outcomes and carryover.  Baseline:  Goal status: IN PROGRESS 07/26/23 - met for current.   2.  Patient will report 75% improvement in low back pain to improve QOL.  Baseline:  Goal status: IN PROGRESS 07/26/23- maybe a little improvement per caregiver  3.  Patient will demonstrate full pain free lumbar ROM to perform ADLs.   Baseline: pain with bending over Goal status: IN PROGRESS 07/26/23- no pain today after MT 08/30/23- reported no pain with lumbar AROM  4.  Patient will demonstrate improved functional strength as demonstrated by 5x STS < 17 seconds. Baseline: 21.9 seconds, intermittant UE support Goal status: IN PROGRESS  5.  Patient will report at least 6 points improvement on modified Oswestry to demonstrate improved functional ability.  Baseline: 13/50 Goal status: IN PROGRESS   6.  Patient will tolerate 1 hour  of standing to participate in special olympics activities. Baseline: increased pain with prolonged standing Goal status: IN PROGRESS  07/26/23- still has pain with standing.   7.   Patient will demonstrate at least 19/24 on DGI to decrease fall risk.  Baseline: NT Goal status: IN PROGRESS 07/03/23 18/24   PLAN:  PT FREQUENCY: 1x/week  PT DURATION: 8 weeks  PLANNED INTERVENTIONS: Therapeutic exercises, Therapeutic activity, Neuromuscular re-education, Balance training, Gait training, Patient/Family education, Self Care, Joint mobilization, Stair training, Prosthetic training, Dry Needling, Electrical stimulation, Spinal manipulation, Spinal mobilization, Cryotherapy, Moist heat,  Taping, Traction, Ultrasound, Manual therapy, and Re-evaluation.  PLAN FOR NEXT SESSION: continue to progress core strengthening,  manual therapy.   Jena Gauss, PT, DPT 10/17/2023, 3:31 PM

## 2023-10-24 ENCOUNTER — Ambulatory Visit: Payer: Medicare Other | Attending: Internal Medicine | Admitting: Physical Therapy

## 2023-10-31 ENCOUNTER — Encounter: Payer: Medicare Other | Admitting: Physical Therapy

## 2023-11-01 ENCOUNTER — Encounter: Payer: Self-pay | Admitting: Physical Therapy

## 2023-11-01 ENCOUNTER — Ambulatory Visit: Payer: Medicare Other | Attending: Internal Medicine | Admitting: Physical Therapy

## 2023-11-01 DIAGNOSIS — R252 Cramp and spasm: Secondary | ICD-10-CM | POA: Diagnosis present

## 2023-11-01 DIAGNOSIS — M6281 Muscle weakness (generalized): Secondary | ICD-10-CM

## 2023-11-01 DIAGNOSIS — M5459 Other low back pain: Secondary | ICD-10-CM

## 2023-11-01 NOTE — Therapy (Signed)
OUTPATIENT PHYSICAL THERAPY TREATMENT   Patient Name: Justin Kerr MRN: [id number] DOB:1966-04-29, 58 y.o., male Today's Date: 11/01/2023  END OF SESSION:  PT End of Session - 11/01/23 1020     Visit Number 15    Date for PT Re-Evaluation 11/10/23    Authorization Type Medicare, Medicaid + Tricare for Life    Progress Note Due on Visit 19    PT Start Time 1019    PT Stop Time 1102    PT Time Calculation (min) 43 min    Activity Tolerance Patient tolerated treatment well    Behavior During Therapy WFL for tasks assessed/performed             Past Medical History:  Diagnosis Date   ADHD    Anxiety    Autism    Fragile X syndrome    Hypertension    Hypertriglyceridemia    Hypothyroidism    acquired   Lumbar spondylosis    Osteopenia    Thrombocytopenia (HCC)    Urge incontinence of urine    Vitamin D deficiency    Past Surgical History:  Procedure Laterality Date   DENTAL RESTORATION/EXTRACTION WITH X-RAY N/A 04/21/2020   Procedure: DENTAL RESTORATION/EXTRACTION WITH X-RAY fillings in teeth 3, 4, 13, 19, 30, and 31 WITH CLEANING AND FLOURIDE;  Surgeon: Cristino Martes, DMD;  Location: [facility] OR;  Service: Dentistry;  Laterality: N/A;   DENTAL SURGERY     wisdom teeth, dental   ELBOW SURGERY Left    There are no active problems to display for this patient.   PCP: Leola Brazil, DO  REFERRING PROVIDER: Leola Brazil, DO   REFERRING DIAG: [phone] (ICD-10-CM) - Spondylosis without myelopathy or radiculopathy, lumbar region   Rationale for Evaluation and Treatment: Rehabilitation  THERAPY DIAG:  Other low back pain  Muscle weakness (generalized)  Cramp and spasm  ONSET DATE: several months ago.   SUBJECTIVE:                                                                                                                                                                                           SUBJECTIVE STATEMENT: Justin Kerr accompanied by  caregiver Justin Kerr.   Justin Kerr says he feels better today.  Has appt. With Dr. Antonietta Barcelona now Feb 19.    PERTINENT HISTORY:  ADHD, autism, fragile X, HTN, hypothyroidism, osteopenia, thrombocytopenia,  lumbar spondylosis  PAIN:  Are you having pain? Yes: NPRS scale: 2  on FACES scale /10 Pain location: low back Pain description: ache Aggravating factors: standing for a long time Relieving factors: sitting down   PRECAUTIONS: None  RED FLAGS: None   WEIGHT BEARING RESTRICTIONS: No  FALLS:  Has patient fallen in last 6 months? No  LIVING ENVIRONMENT: Lives with: lives with their family and lives in an adult home Lives in: House/apartment Stairs: Yes: Internal: 16 steps; on right going up and on left going up and External: 3 steps; on right going up and on left going up Has following equipment at home: Grab bars  OCCUPATION: attends day program   PLOF: Independent with basic ADLs  PATIENT GOALS: decrease pain, walking better, get ready for bocce  NEXT MD VISIT: Feb 19 Dr. Antonietta Barcelona  OBJECTIVE:   DIAGNOSTIC FINDINGS:  04/26/23 XR Lumbar spine IMPRESSION:  1. No acute fracture or traumatic malalignment.  2.  Mild multilevel degenerative disease most pronounced at L4-L5 and L5-S1.  3.  Moderate facet arthropathy at L4-L5 and L5-S1   PATIENT SURVEYS:  Modified Oswestry 13/50   COGNITION: Overall cognitive status: History of cognitive impairments - at baseline     SENSATION: Not tested  MUSCLE LENGTH: Hamstrings: moderate tightness bil, R>L  Quads: moderate tightness bil   POSTURE:  thoracolumbar scoliosis with  shif to left and compensatory R head tilt, weight shift to left in sitting and standing , in prone, noted R tibia shorter than L  PALPATION: Tenderness lumbar spine and R QL  LUMBAR ROM:  very sudden movements, can touch toes, painful.   LUMBAR ROM:  09/15/23  Active  A/PROM  eval  Flexion To ankles, pain R side  Extension   Right lateral flexion Pain R side   Left lateral flexion Pain R side  Right rotation No pain  Left rotation Pain R side   (Blank rows = not tested)   LOWER EXTREMITY ROM:     Tightness in L hip ER/IR compared to R hip.   LOWER EXTREMITY MMT:   functionally 4/5, however due to cognitive deficits difficulty following directions for MMT, can stand on toes, could not follow commands to walk on heels, 5/5 knee extension, 4/5 hip flexion.    LUMBAR SPECIAL TESTS:  Straight leg raise test: Negative  FUNCTIONAL TESTS:  5 times sit to stand: 21.9 seconds, intermittent UE assist  09/15/23 22.5 seconds.  Gait speed 0.94 m/s   GAIT: Distance walked: 100' Assistive device utilized: None Level of assistance: Complete Independence Comments: wide BOS, leans to left, turns LLE externally rotated, weight shift to left.    TODAY'S TREATMENT:                                                                                                                              DATE:   11/01/2023 Therapeutic Exercise: to improve strength and mobility.  Demo, verbal and tactile cues throughout for technique. Nustep L6 x 6 min Prone leg extensions - bent knee x 10 r/l - tactile cues only today Prone arm extensions - unable on R, reported R arm pain, minA L arm Seated side stretch over  bolster - again reported R arm pain, worsened with L SB neck Prone on elbows - with neck extension Therapeutic Activity:  assessing R shoulder pain- noted trigger points around R UT, L/S and R rotator cuff attachment/anterior shoulder.  Weakness R shoulder flexion.  Manual Therapy: to decrease muscle spasm and pain and improve mobility STM/TPR to R QL, glute med, percussive massage to R glutes and QL Modalities: MHP to low back and R shoulder in sitting post session while waiting in room for caregiver.     10/17/2023 Therapeutic Exercise: to improve strength and mobility.  Demo, verbal and tactile cues throughout for technique. Nustep L5 x 6 min Leg extensions -  while leaning over table, x 10 R/L - mod/maxA needed today Leg extensions prone on table - still needed maxA and tactile cues - trying to engage hip flexors instead of extensiors.  Supine bridges 2 x 10 Seated side stretches over bolster x 1 min each side.  Manual Therapy: to decrease muscle spasm and pain and improve mobility STM/TPR to R QL, glute med, percussive massage to R glutes and QL Modalities: MHP to low back in sitting post session while waiting in room for caregiver.    10/10/2023 Therapeutic Exercise: to improve strength and mobility.  Demo, verbal and tactile cues throughout for technique.  Nustep L5 x 6 min  Leg extensions - while leaning over table, minA  x 10 R/L -  Bil leg extensions while holding table x 10 Supine bridges 2 x 10 Supine SLR 2 x 10 R/L - min/modA needed to keep knees straight HS stretches with strap - modA x 2 min each side Manual Therapy: to decrease muscle spasm and pain and improve mobility STM/TPR to R QL, glute med, percussive massage to R glutes and QL, skilled palpation and monitoring during dry needling. Trigger Point Dry Needling  Subsequent Treatment: Instructions provided previously at initial dry needling treatment.   Patient Verbal Consent Given: Yes Education Handout Provided: Previously Provided Muscles Treated: R glute med Electrical Stimulation Performed: No Treatment Response/Outcome: Palpable Increase in Muscle Length Modalities: MHP to back in sitting after session while waiting for caregiver.  (Caregiver had next appt. So remained in room during appt.)   PATIENT EDUCATION:  Education details: continue HEP as tolerated Person educated: Patient and Publishing rights manager Education method: Explanation Education comprehension: verbalized understanding  HOME EXERCISE PROGRAM: Access Code: [email or web address] URL: [email or web address] Date: 06/14/2023 Prepared by: Harrie Foreman  Exercises - Left Standing Lateral Shift  Correction at Wall - Repetitions  - 1 x daily - 7 x weekly - 2 sets - 10 reps - 3 sec hold - Supine Bridge  - 1 x daily - 7 x weekly - 2 sets - 10 reps - 3 sec hold - Seated Trunk Rotation  - 2 x daily - 7 x weekly - 1 sets - 10 reps - Seated Quadratus Lumborum Stretch in Chair  - 2 x daily - 7 x weekly - 1 sets - 10 reps  ASSESSMENT:  CLINICAL IMPRESSION: Justin Kerr reports that his back is better, and he was able to perform leg extensions today with much less assistance.  However, he had significant difficulty with using RUE when tried arm extensions in prone, upon questioning reported he had hurt it several days ago lifting boxes (caregiver did report he had been complaining about his shoulder).  He had very painful spasms and L SB of neck also increased pain, but point tender over R anterior shoulder.  Justin Kerr requested  TrDN but since planning on swimming today did not perform due to risk of infection.  Reported decreased pain after manual therapy and MHP, also applied MHP to shoulder to decrease spasms.  Recommend follow-up with sports medicine for further evaluation of R shoulder pain.  Justin Kerr continues to demonstrate potential for improvement and would benefit from continued skilled therapy to address impairments.     OBJECTIVE IMPAIRMENTS: Abnormal gait, decreased activity tolerance, decreased balance, decreased endurance, decreased knowledge of condition, difficulty walking, decreased ROM, decreased strength, hypomobility, increased fascial restrictions, increased muscle spasms, postural dysfunction, and pain.   ACTIVITY LIMITATIONS: carrying, lifting, bending, standing, sleeping, and locomotion level  PARTICIPATION LIMITATIONS: community activity and yard work  PERSONAL FACTORS: Time since onset of injury/illness/exacerbation and 3+ comorbidities: ADHD, autism, fragile X, HTN, hypothyroidism, osteopenia, thrombocytopenia,  lumbar spondylosis  are also affecting patient's functional  outcome.   REHAB POTENTIAL: Good  CLINICAL DECISION MAKING: Evolving/moderate complexity  EVALUATION COMPLEXITY: Moderate   GOALS: Goals reviewed with patient? Yes  SHORT TERM GOALS: Target date: 06/14/2023   Patient will be independent with initial HEP.  Baseline: needs Goal status: MET 06/09/23 - HEP given.  06/19/23- met   LONG TERM GOALS: Target date: extended to 11/10/2023 Patient will be independent with advanced/ongoing HEP to improve outcomes and carryover.  Baseline:  Goal status: IN PROGRESS 07/26/23 - met for current.   2.  Patient will report 75% improvement in low back pain to improve QOL.  Baseline:  Goal status: IN PROGRESS 07/26/23- maybe a little improvement per caregiver  3.  Patient will demonstrate full pain free lumbar ROM to perform ADLs.   Baseline: pain with bending over Goal status: IN PROGRESS 07/26/23- no pain today after MT 08/30/23- reported no pain with lumbar AROM  4.  Patient will demonstrate improved functional strength as demonstrated by 5x STS < 17 seconds. Baseline: 21.9 seconds, intermittant UE support Goal status: IN PROGRESS  5.  Patient will report at least 6 points improvement on modified Oswestry to demonstrate improved functional ability.  Baseline: 13/50 Goal status: IN PROGRESS   6.  Patient will tolerate 1 hour  of standing to participate in special olympics activities. Baseline: increased pain with prolonged standing Goal status: IN PROGRESS  07/26/23- still has pain with standing.   7.   Patient will demonstrate at least 19/24 on DGI to decrease fall risk.  Baseline: NT Goal status: IN PROGRESS 07/03/23 18/24   PLAN:  PT FREQUENCY: 1x/week  PT DURATION: 8 weeks  PLANNED INTERVENTIONS: Therapeutic exercises, Therapeutic activity, Neuromuscular re-education, Balance training, Gait training, Patient/Family education, Self Care, Joint mobilization, Stair training, Prosthetic training, Dry Needling, Electrical stimulation,  Spinal manipulation, Spinal mobilization, Cryotherapy, Moist heat, Taping, Traction, Ultrasound, Manual therapy, and Re-evaluation.  PLAN FOR NEXT SESSION: continue to progress core strengthening,  manual therapy.   Jena Gauss, PT, DPT 11/01/2023, 1:51 PM

## 2023-11-07 ENCOUNTER — Encounter: Payer: Medicare Other | Admitting: Physical Therapy

## 2023-11-08 ENCOUNTER — Encounter: Payer: Self-pay | Admitting: Physical Therapy

## 2023-11-08 ENCOUNTER — Ambulatory Visit: Payer: Medicare Other | Admitting: Physical Therapy

## 2023-11-08 DIAGNOSIS — M5459 Other low back pain: Secondary | ICD-10-CM

## 2023-11-08 DIAGNOSIS — M6281 Muscle weakness (generalized): Secondary | ICD-10-CM

## 2023-11-08 DIAGNOSIS — R252 Cramp and spasm: Secondary | ICD-10-CM

## 2023-11-08 NOTE — Therapy (Signed)
OUTPATIENT PHYSICAL THERAPY TREATMENT/RECERT Progress Note Reporting Period 09/15/23 to 11/08/23  See note below for Objective Data and Assessment of Progress/Goals.      Patient Name: Justin Kerr MRN: [id number] DOB:May 31, 1966, 58 y.o., male Today's Date: 11/08/2023  END OF SESSION:  PT End of Session - 11/08/23 1018     Visit Number 16    Date for PT Re-Evaluation 12/20/23    Authorization Type Medicare, Medicaid + [employer] for [employer]    Progress Note Due on Visit 19    PT Start Time 1015    PT Stop Time 1049    PT Time Calculation (min) 34 min    Activity Tolerance Patient tolerated treatment well    Behavior During Therapy WFL for tasks assessed/performed             Past Medical History:  Diagnosis Date   ADHD    Anxiety    Autism    Fragile X syndrome    Hypertension    Hypertriglyceridemia    Hypothyroidism    acquired   Lumbar spondylosis    Osteopenia    Thrombocytopenia (HCC)    Urge incontinence of urine    Vitamin D deficiency    Past Surgical History:  Procedure Laterality Date   DENTAL RESTORATION/EXTRACTION WITH X-RAY N/A 04/21/2020   Procedure: DENTAL RESTORATION/EXTRACTION WITH X-RAY fillings in teeth 3, 4, 13, 19, 30, and 31 WITH CLEANING AND FLOURIDE;  Surgeon: Cristino Martes, DMD;  Location: [facility] OR;  Service: Dentistry;  Laterality: N/A;   DENTAL SURGERY     wisdom teeth, dental   ELBOW SURGERY Left    There are no active problems to display for this patient.   PCP: Leola Brazil, DO  REFERRING PROVIDER: Leola Brazil, DO   REFERRING DIAG: [phone] (ICD-10-CM) - Spondylosis without myelopathy or radiculopathy, lumbar region   Rationale for Evaluation and Treatment: Rehabilitation  THERAPY DIAG:  Other low back pain  Muscle weakness (generalized)  Cramp and spasm  ONSET DATE: several months ago.   SUBJECTIVE:                                                                                                                                                                                            SUBJECTIVE STATEMENT: Justin Kerr accompanied by caregiver [name].   Justin Kerr says he feels better today.  Sees Dr. Antonietta Barcelona today.     PERTINENT HISTORY:  ADHD, autism, fragile X, HTN, hypothyroidism, osteopenia, thrombocytopenia,  lumbar spondylosis  PAIN:  Are you having pain? Yes: NPRS scale: 2  on FACES scale /10 Pain location: low back Pain  description: ache Aggravating factors: standing for a long time Relieving factors: sitting down   PRECAUTIONS: None  RED FLAGS: None   WEIGHT BEARING RESTRICTIONS: No  FALLS:  Has patient fallen in last 6 months? No  LIVING ENVIRONMENT: Lives with: lives with their family and lives in an adult home Lives in: House/apartment Stairs: Yes: Internal: 16 steps; on right going up and on left going up and External: 3 steps; on right going up and on left going up Has following equipment at home: Grab bars  OCCUPATION: attends day program   PLOF: Independent with basic ADLs  PATIENT GOALS: decrease pain, walking better, get ready for bocce  NEXT MD VISIT: Feb 19 Dr. Antonietta Barcelona  OBJECTIVE:   DIAGNOSTIC FINDINGS:  04/26/23 XR Lumbar spine IMPRESSION:  1. No acute fracture or traumatic malalignment.  2.  Mild multilevel degenerative disease most pronounced at L4-L5 and L5-S1.  3.  Moderate facet arthropathy at L4-L5 and L5-S1   PATIENT SURVEYS:  Modified Oswestry 13/50   COGNITION: Overall cognitive status: History of cognitive impairments - at baseline     SENSATION: Not tested  MUSCLE LENGTH: Hamstrings: moderate tightness bil, R>L  Quads: moderate tightness bil   POSTURE:  thoracolumbar scoliosis with  shif to left and compensatory R head tilt, weight shift to left in sitting and standing , in prone, noted R tibia shorter than L  PALPATION: Tenderness lumbar spine and R QL  LUMBAR ROM:  very sudden movements, can touch toes, painful.   LUMBAR ROM:   09/15/23  Active  A/PROM  eval AROM 11/08/23  Flexion To ankles, pain R side To ankles, no pain  Extension  WNL, no pain  Right lateral flexion Pain R side No pain  Left lateral flexion Pain R side No pain  Right rotation No pain No pain  Left rotation Pain R side No pain   (Blank rows = not tested)   LOWER EXTREMITY ROM:     Tightness in L hip ER/IR compared to R hip.   LOWER EXTREMITY MMT:   functionally 4/5, however due to cognitive deficits difficulty following directions for MMT, can stand on toes, could not follow commands to walk on heels, 5/5 knee extension, 4/5 hip flexion.    LUMBAR SPECIAL TESTS:  Straight leg raise test: Negative  FUNCTIONAL TESTS:  5 times sit to stand: 21.9 seconds, intermittent UE assist  09/15/23 22.5 seconds.  Gait speed 0.94 m/s   GAIT: Distance walked: 100' Assistive device utilized: None Level of assistance: Complete Independence Comments: wide BOS, leans to left, turns LLE externally rotated, weight shift to left.    TODAY'S TREATMENT:                                                                                                                              DATE:   11/08/23  Therapeutic Exercise: to improve strength and mobility.  Demo, verbal and tactile cues throughout for technique. Nustep  L6 x 7 min   Tests & Measures 5x STS  - repeated 3 times due to cognitive deficits needed cues to complete correctly.  Last rep performed in 16 seconds.   [facility] PT Assessment - 11/08/23 0001       Standardized Balance Assessment   Standardized Balance Assessment Dynamic Gait Index      Dynamic Gait Index   Level Surface Mild Impairment    Change in Gait Speed Mild Impairment    Gait with Horizontal Head Turns Normal    Gait with Vertical Head Turns Normal    Gait and Pivot Turn Normal    Step Over Obstacle Normal    Step Around Obstacles Normal    Steps Mild Impairment    Total Score 21           DGI 22/24 low risk of falls on  community dwelling adult  Lumbar AROM - full AROM without pain, even with loading into extension and rotation Review of goals with caregiver   11/01/2023 Therapeutic Exercise: to improve strength and mobility.  Demo, verbal and tactile cues throughout for technique. Nustep L6 x 6 min Prone leg extensions - bent knee x 10 r/l - tactile cues only today Prone arm extensions - unable on R, reported R arm pain, minA L arm Seated side stretch over bolster - again reported R arm pain, worsened with L SB neck Prone on elbows - with neck extension Therapeutic Activity:  assessing R shoulder pain- noted trigger points around R UT, L/S and R rotator cuff attachment/anterior shoulder.  Weakness R shoulder flexion.  Manual Therapy: to decrease muscle spasm and pain and improve mobility STM/TPR to R QL, glute med, percussive massage to R glutes and QL Modalities: MHP to low back and R shoulder in sitting post session while waiting in room for caregiver.     10/17/2023 Therapeutic Exercise: to improve strength and mobility.  Demo, verbal and tactile cues throughout for technique. Nustep L5 x 6 min Leg extensions - while leaning over table, x 10 R/L - mod/maxA needed today Leg extensions prone on table - still needed maxA and tactile cues - trying to engage hip flexors instead of extensiors.  Supine bridges 2 x 10 Seated side stretches over bolster x 1 min each side.  Manual Therapy: to decrease muscle spasm and pain and improve mobility STM/TPR to R QL, glute med, percussive massage to R glutes and QL Modalities: MHP to low back in sitting post session while waiting in room for caregiver.    PATIENT EDUCATION:  Education details: continue HEP as tolerated Person educated: Patient and Publishing rights manager Education method: Explanation Education comprehension: verbalized understanding  HOME EXERCISE PROGRAM: Access Code: [email or web address] URL: [email or web address] Date: 06/14/2023 Prepared by:  Harrie Foreman  Exercises - Left Standing Lateral Shift Correction at Wall - Repetitions  - 1 x daily - 7 x weekly - 2 sets - 10 reps - 3 sec hold - Supine Bridge  - 1 x daily - 7 x weekly - 2 sets - 10 reps - 3 sec hold - Seated Trunk Rotation  - 2 x daily - 7 x weekly - 1 sets - 10 reps - Seated Quadratus Lumborum Stretch in Chair  - 2 x daily - 7 x weekly - 1 sets - 10 reps  ASSESSMENT:  CLINICAL IMPRESSION: Ferron reports that his back is better, when asked to point to pain still points to lower R side, and still has significant postural deviation/scoliotic posture.  His caregiver reports he is still quite limited with standing tolerance.  Back pain is localized to lower R lumbar region, but today he reported no pain with lumbar AROM or with loaded extension and rotation.  He does follow up with Dr. Antonietta Barcelona today as caregiver does report he does have "spells" of worsened gait as well.   Domenik's DGI has improved to 21/24, which indicates lower risk of falls, and his 5x STS has also improved to 16 seconds, again, lowering his risk of falls and improved functional LE strength.  He has now met LTG #3,4 and 7.  He has started to participate in [employer] swimming, which should help improve extensor/core strength.  Justin Kerr continues to demonstrate potential for improvement and would benefit from continued skilled therapy to address impairments.  Extending POC for additional 1x/week for 6 more weeks to continue to progress postural strengthening and address LBP and standing tolerance.    OBJECTIVE IMPAIRMENTS: Abnormal gait, decreased activity tolerance, decreased balance, decreased endurance, decreased knowledge of condition, difficulty walking, decreased ROM, decreased strength, hypomobility, increased fascial restrictions, increased muscle spasms, postural dysfunction, and pain.   ACTIVITY LIMITATIONS: carrying, lifting, bending, standing, sleeping, and locomotion level  PARTICIPATION  LIMITATIONS: community activity and yard work  PERSONAL FACTORS: Time since onset of injury/illness/exacerbation and 3+ comorbidities: ADHD, autism, fragile X, HTN, hypothyroidism, osteopenia, thrombocytopenia,  lumbar spondylosis  are also affecting patient's functional outcome.   REHAB POTENTIAL: Good  CLINICAL DECISION MAKING: Evolving/moderate complexity  EVALUATION COMPLEXITY: Moderate   GOALS: Goals reviewed with patient? Yes  SHORT TERM GOALS: Target date: 06/14/2023   Patient will be independent with initial HEP.  Baseline: needs Goal status: MET 06/09/23 - HEP given.  06/19/23- met   LONG TERM GOALS: Target date: extended to 12/20/2023 Patient will be independent with advanced/ongoing HEP to improve outcomes and carryover.  Baseline:  Goal status: IN PROGRESS 07/26/23 - met for current.   2.  Patient will report 75% improvement in low back pain to improve QOL.  Baseline:  Goal status: IN PROGRESS 07/26/23- maybe a little improvement per caregiver 11/08/23 - says pain is "better" rates 2/10, still leaning with walking  3.  Patient will demonstrate full pain free lumbar ROM to perform ADLs.   Baseline: pain with bending over Goal status: MET 07/26/23- no pain today after MT 08/30/23- reported no pain with lumbar AROM  11/08/23- full AROM today, reports no pain with putting on shoes and socks.   4.  Patient will demonstrate improved functional strength as demonstrated by 5x STS < 17 seconds. Baseline: 21.9 seconds, intermittant UE support Goal status: MET  11/08/23 16 seconds  5.  Patient will report at least 6 points improvement on modified Oswestry to demonstrate improved functional ability.  Baseline: 13/50 Goal status: IN PROGRESS   6.  Patient will tolerate 1 hour  of standing to participate in special olympics activities. Baseline: increased pain with prolonged standing Goal status: IN PROGRESS  07/26/23- still has pain with standing.  11/08/23- still limited with  standing.   7.   Patient will demonstrate at least 19/24 on DGI to decrease fall risk.  Baseline: NT Goal status: MET 07/03/23 18/24  11/08/23 21/24   PLAN:  PT FREQUENCY: 1x/week  PT DURATION: 6 weeks  PLANNED INTERVENTIONS: Therapeutic exercises, Therapeutic activity, Neuromuscular re-education, Balance training, Gait training, Patient/Family education, Self Care, Joint mobilization, Stair training, Prosthetic training, Dry Needling, Electrical stimulation, Spinal manipulation, Spinal mobilization, Cryotherapy, Moist heat, Taping, Traction, Ultrasound,  Manual therapy, and Re-evaluation.  PLAN FOR NEXT SESSION: continue to progress core strengthening,  manual therapy.   Jena Gauss, PT, DPT 11/08/2023, 12:54 PM

## 2023-11-14 ENCOUNTER — Ambulatory Visit: Payer: Medicare Other | Admitting: Physical Therapy

## 2023-11-21 ENCOUNTER — Encounter: Payer: Medicare Other | Admitting: Physical Therapy

## 2023-11-28 ENCOUNTER — Ambulatory Visit: Payer: Medicare Other | Admitting: Physical Therapy

## 2023-12-06 ENCOUNTER — Ambulatory Visit: Attending: Internal Medicine | Admitting: Physical Therapy

## 2023-12-06 ENCOUNTER — Encounter: Payer: Self-pay | Admitting: Physical Therapy

## 2023-12-06 DIAGNOSIS — M5459 Other low back pain: Secondary | ICD-10-CM | POA: Diagnosis present

## 2023-12-06 DIAGNOSIS — R252 Cramp and spasm: Secondary | ICD-10-CM | POA: Diagnosis present

## 2023-12-06 DIAGNOSIS — M6281 Muscle weakness (generalized): Secondary | ICD-10-CM | POA: Diagnosis present

## 2023-12-06 NOTE — Therapy (Addendum)
 OUTPATIENT PHYSICAL THERAPY TREATMENT/Discharge Summary   Patient Name: Justin Kerr MRN: [id number] DOB:1966/06/06, 58 y.o., male Today's Date: 12/06/2023  END OF SESSION:  PT End of Session - 12/06/23 0943     Visit Number 17    Date for PT Re-Evaluation 12/20/23    Authorization Type Medicare, Medicaid + Tric[employer] for Life    Progress Note Due on Visit 19    PT Start Time 0945    PT Stop Time 1000    PT Time Calculation (min) 15 min    Activity Tolerance Patient tolerated treatment well    Behavior During Therapy WFL for tasks assessed/performed             Past Medical History:  Diagnosis Date   ADHD    Anxiety    Autism    Fragile X syndrome    Hypertension    Hypertriglyceridemia    Hypothyroidism    acquired   Lumbar spondylosis    Osteopenia    Thrombocytopenia (HCC)    Urge incontinence of urine    Vitamin D deficiency    Past Surgical History:  Procedure Laterality Date   DENTAL RESTORATION/EXTRACTION WITH X-RAY N/A 04/21/2020   Procedure: DENTAL RESTORATION/EXTRACTION WITH X-RAY fillings in teeth 3, 4, 13, 19, 30, and 31 WITH CLEANING AND FLOURIDE;  Surgeon: Cristino Martes, DMD;  Location: [facility] OR;  Service: Dentistry;  Laterality: N/A;   DENTAL SURGERY     wisdom teeth, dental   ELBOW SURGERY Left    There are no active problems to display for this patient.   PCP: Leola Brazil, DO  REFERRING PROVIDER: Leola Brazil, DO   REFERRING DIAG: [phone] (ICD-10-CM) - Spondylosis without myelopathy or radiculopathy, lumbar region   Rationale for Evaluation and Treatment: Rehabilitation  THERAPY DIAG:  Other low back pain  Muscle weakness (generalized)  Cramp and spasm  ONSET DATE: several months ago.   SUBJECTIVE:                                                                                                                                                                                           SUBJECTIVE STATEMENT: Justin Kerr  accompanied by caregiver [name].  [name] reports he was put on several new medications, including metformin, mounjaro, a statin, and flexeril for the back pain.  Having more GI upset with the diabetes medications.   Romario says his back feels good today.  Sees Dr. Antonietta Barcelona today.     PERTINENT HISTORY:  ADHD, autism, fragile X, HTN, hypothyroidism, osteopenia, thrombocytopenia,  lumbar spondylosis  PAIN:  Are you having pain? Yes: NPRS scale:  2  on FACES scale /10 Pain location: low back Pain description: ache Aggravating factors: standing for a long time Relieving factors: sitting down   PRECAUTIONS: None  RED FLAGS: None   WEIGHT BEARING RESTRICTIONS: No  FALLS:  Has patient fallen in last 6 months? No  LIVING ENVIRONMENT: Lives with: lives with their family and lives in an adult home Lives in: House/apartment Stairs: Yes: Internal: 16 steps; on right going up and on left going up and External: 3 steps; on right going up and on left going up Has following equipment at home: Grab bars  OCCUPATION: attends day program   PLOF: Independent with basic ADLs  PATIENT GOALS: decrease pain, walking better, get ready for bocce  NEXT MD VISIT: Feb 19 Dr. Antonietta Barcelona  OBJECTIVE:   DIAGNOSTIC FINDINGS:  04/26/23 XR Lumbar spine IMPRESSION:  1. No acute fracture or traumatic malalignment.  2.  Mild multilevel degenerative disease most pronounced at L4-L5 and L5-S1.  3.  Moderate facet arthropathy at L4-L5 and L5-S1   PATIENT SURVEYS:  Modified Oswestry 13/50   COGNITION: Overall cognitive status: History of cognitive impairments - at baseline     SENSATION: Not tested  MUSCLE LENGTH: Hamstrings: moderate tightness bil, R>L  Quads: moderate tightness bil   POSTURE:  thoracolumbar scoliosis with  shif to left and compensatory R head tilt, weight shift to left in sitting and standing , in prone, noted R tibia shorter than L  PALPATION: Tenderness lumbar spine and R QL  LUMBAR ROM:   very sudden movements, can touch toes, painful.   LUMBAR ROM:  09/15/23  Active  A/PROM  eval AROM 11/08/23  Flexion To ankles, pain R side To ankles, no pain  Extension  WNL, no pain  Right lateral flexion Pain R side No pain  Left lateral flexion Pain R side No pain  Right rotation No pain No pain  Left rotation Pain R side No pain   (Blank rows = not tested)   LOWER EXTREMITY ROM:     Tightness in L hip ER/IR compared to R hip.   LOWER EXTREMITY MMT:   functionally 4/5, however due to cognitive deficits difficulty following directions for MMT, can stand on toes, could not follow commands to walk on heels, 5/5 knee extension, 4/5 hip flexion.    LUMBAR SPECIAL TESTS:  Straight leg raise test: Negative  FUNCTIONAL TESTS:  5 times sit to stand: 21.9 seconds, intermittent UE assist  09/15/23 22.5 seconds.  Gait speed 0.94 m/s   GAIT: Distance walked: 100' Assistive device utilized: None Level of assistance: Complete Independence Comments: wide BOS, leans to left, turns LLE externally rotated, weight shift to left.    TODAY'S TREATMENT:                                                                                                                              DATE:   12/06/23 Therapeutic Activity:  for LE strengthening Nustep  L5 x 6 min  Stairs x 1 flight Gait x 900'  11/08/23  Therapeutic Exercise: to improve strength and mobility.  Demo, verbal and tactile cues throughout for technique. Nustep L6 x 7 min   Tests & Measures 5x STS  - repeated 3 times due to cognitive deficits needed cues to complete correctly.  Last rep performed in 16 seconds.   [facility] PT Assessment - 11/08/23 0001       Standardized Balance Assessment   Standardized Balance Assessment Dynamic Gait Index      Dynamic Gait Index   Level Surface Mild Impairment    Change in Gait Speed Mild Impairment    Gait with Horizontal Head Turns Normal    Gait with Vertical Head Turns Normal    Gait and  Pivot Turn Normal    Step Over Obstacle Normal    Step Around Obstacles Normal    Steps Mild Impairment    Total Score 21           DGI 22/24 low risk of falls on community dwelling adult  Lumbar AROM - full AROM without pain, even with loading into extension and rotation Review of goals with caregiver   11/01/2023 Therapeutic Exercise: to improve strength and mobility.  Demo, verbal and tactile cues throughout for technique. Nustep L6 x 6 min Prone leg extensions - bent knee x 10 r/l - tactile cues only today Prone arm extensions - unable on R, reported R arm pain, minA L arm Seated side stretch over bolster - again reported R arm pain, worsened with L SB neck Prone on elbows - with neck extension Therapeutic Activity:  assessing R shoulder pain- noted trigger points around R UT, L/S and R rotator cuff attachment/anterior shoulder.  Weakness R shoulder flexion.  Manual Therapy: to decrease muscle spasm and pain and improve mobility STM/TPR to R QL, glute med, percussive massage to R glutes and QL Modalities: MHP to low back and R shoulder in sitting post session while waiting in room for caregiver.     PATIENT EDUCATION:  Education details: continue HEP as tolerated Person educated: Patient and Publishing rights manager Education method: Explanation Education comprehension: verbalized understanding  HOME EXERCISE PROGRAM: Access Code: [email or web address] URL: [email or web address] Date: 06/14/2023 Prepared by: Harrie Foreman  Exercises - Left Standing Lateral Shift Correction at Wall - Repetitions  - 1 x daily - 7 x weekly - 2 sets - 10 reps - 3 sec hold - Supine Bridge  - 1 x daily - 7 x weekly - 2 sets - 10 reps - 3 sec hold - Seated Trunk Rotation  - 2 x daily - 7 x weekly - 1 sets - 10 reps - Seated Quadratus Lumborum Stretch in Chair  - 2 x daily - 7 x weekly - 1 sets - 10 reps  ASSESSMENT:  CLINICAL IMPRESSION:  Colbert is walking straighter with less complaint of  pain, however session was cut short today due to GI upset.  Since Naif has started a lot of new medications, after discussion with caregiver, decided to discharge Kedrick for now and observe for 6 weeks to given him time to adjust to new medications, lose weight and increase activity.  If he starts to complain of back pain again, caregiver will request new order from his PCP.      OBJECTIVE IMPAIRMENTS: Abnormal gait, decreased activity tolerance, decreased balance, decreased endurance, decreased knowledge of condition, difficulty walking, decreased ROM, decreased strength, hypomobility, increased fascial restrictions, increased muscle spasms, postural  dysfunction, and pain.   ACTIVITY LIMITATIONS: carrying, lifting, bending, standing, sleeping, and locomotion level  PARTICIPATION LIMITATIONS: community activity and yard work  PERSONAL FACTORS: Time since onset of injury/illness/exacerbation and 3+ comorbidities: ADHD, autism, fragile X, HTN, hypothyroidism, osteopenia, thrombocytopenia,  lumbar spondylosis  are also affecting patient's functional outcome.   REHAB POTENTIAL: Good  CLINICAL DECISION MAKING: Evolving/moderate complexity  EVALUATION COMPLEXITY: Moderate   GOALS: Goals reviewed with patient? Yes  SHORT TERM GOALS: Target date: 06/14/2023   Patient will be independent with initial HEP.  Baseline: needs Goal status: MET 06/09/23 - HEP given.  06/19/23- met   LONG TERM GOALS: Target date: extended to 12/20/2023 Patient will be independent with advanced/ongoing HEP to improve outcomes and carryover.  Baseline:  Goal status: IN PROGRESS 07/26/23 - met for current.   2.  Patient will report 75% improvement in low back pain to improve QOL.  Baseline:  Goal status: IN PROGRESS 07/26/23- maybe a little improvement per caregiver 11/08/23 - says pain is "better" rates 2/10, still leaning with walking  3.  Patient will demonstrate full pain free lumbar ROM to perform ADLs.   Baseline:  pain with bending over Goal status: MET 07/26/23- no pain today after MT 08/30/23- reported no pain with lumbar AROM  11/08/23- full AROM today, reports no pain with putting on shoes and socks.   4.  Patient will demonstrate improved functional strength as demonstrated by 5x STS < 17 seconds. Baseline: 21.9 seconds, intermittant UE support Goal status: MET  11/08/23 16 seconds  5.  Patient will report at least 6 points improvement on modified Oswestry to demonstrate improved functional ability.  Baseline: 13/50 Goal status: IN PROGRESS   6.  Patient will tolerate 1 hour  of standing to participate in special olympics activities. Baseline: increased pain with prolonged standing Goal status: IN PROGRESS  07/26/23- still has pain with standing.  11/08/23- still limited with standing.   7.   Patient will demonstrate at least 19/24 on DGI to decrease fall risk.  Baseline: NT Goal status: MET 07/03/23 18/24  11/08/23 21/24   PLAN:  PT FREQUENCY: 1x/week  PT DURATION: 6 weeks  PLANNED INTERVENTIONS: Therapeutic exercises, Therapeutic activity, Neuromuscular re-education, Balance training, Gait training, Patient/Family education, Self Care, Joint mobilization, Stair training, Prosthetic training, Dry Needling, Electrical stimulation, Spinal manipulation, Spinal mobilization, Cryotherapy, Moist heat, Taping, Traction, Ultrasound, Manual therapy, and Re-evaluation.  PLAN FOR NEXT SESSION: discharge to HEP   Jena Gauss, PT, DPT 12/06/2023, 10:05 AM
